# Patient Record
Sex: Female | Born: 1985 | Race: White | Hispanic: No | Marital: Married | Smoking: Former smoker
Health system: Southern US, Community
[De-identification: ages and names within clinical notes are randomized; demographics above are authoritative.]

## PROBLEM LIST (undated history)

## (undated) DIAGNOSIS — B009 Herpesviral infection, unspecified: Secondary | ICD-10-CM

## (undated) DIAGNOSIS — E079 Disorder of thyroid, unspecified: Secondary | ICD-10-CM

## (undated) DIAGNOSIS — K219 Gastro-esophageal reflux disease without esophagitis: Secondary | ICD-10-CM

## (undated) DIAGNOSIS — F419 Anxiety disorder, unspecified: Secondary | ICD-10-CM

## (undated) DIAGNOSIS — J40 Bronchitis, not specified as acute or chronic: Secondary | ICD-10-CM

## (undated) HISTORY — DX: Bronchitis, not specified as acute or chronic: J40

## (undated) HISTORY — DX: Herpesviral infection, unspecified: B00.9

## (undated) HISTORY — DX: Anxiety disorder, unspecified: F41.9

## (undated) HISTORY — DX: Disorder of thyroid, unspecified: E07.9

## (undated) HISTORY — DX: Gastro-esophageal reflux disease without esophagitis: K21.9

---
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
## 2004-02-05 ENCOUNTER — Other Ambulatory Visit: Admission: RE | Admit: 2004-02-05 | Discharge: 2004-02-05 | Payer: Self-pay | Admitting: Obstetrics and Gynecology

## 2004-03-11 ENCOUNTER — Ambulatory Visit: Payer: Self-pay | Admitting: Pediatrics

## 2004-08-15 ENCOUNTER — Ambulatory Visit: Payer: Self-pay | Admitting: Pediatrics

## 2005-02-14 ENCOUNTER — Ambulatory Visit: Payer: Self-pay | Admitting: Pediatrics

## 2005-04-17 ENCOUNTER — Other Ambulatory Visit: Admission: RE | Admit: 2005-04-17 | Discharge: 2005-04-17 | Payer: Self-pay | Admitting: Obstetrics and Gynecology

## 2005-07-07 ENCOUNTER — Ambulatory Visit: Payer: Self-pay | Admitting: Pediatrics

## 2005-11-20 ENCOUNTER — Ambulatory Visit: Payer: Self-pay | Admitting: Pediatrics

## 2006-03-19 ENCOUNTER — Ambulatory Visit: Payer: Self-pay | Admitting: Pediatrics

## 2006-05-14 ENCOUNTER — Other Ambulatory Visit: Admission: RE | Admit: 2006-05-14 | Discharge: 2006-05-14 | Payer: Self-pay | Admitting: Obstetrics and Gynecology

## 2006-07-23 ENCOUNTER — Ambulatory Visit: Payer: Self-pay | Admitting: Pediatrics

## 2006-12-01 ENCOUNTER — Ambulatory Visit: Payer: Self-pay | Admitting: Pediatrics

## 2007-05-24 ENCOUNTER — Other Ambulatory Visit: Admission: RE | Admit: 2007-05-24 | Discharge: 2007-05-24 | Payer: Self-pay | Admitting: Obstetrics and Gynecology

## 2008-07-04 ENCOUNTER — Encounter: Admission: RE | Admit: 2008-07-04 | Discharge: 2008-07-04 | Payer: Self-pay | Admitting: Internal Medicine

## 2009-06-25 ENCOUNTER — Other Ambulatory Visit: Admission: RE | Admit: 2009-06-25 | Discharge: 2009-06-25 | Payer: Self-pay | Admitting: Internal Medicine

## 2010-10-10 ENCOUNTER — Other Ambulatory Visit (HOSPITAL_COMMUNITY)
Admission: RE | Admit: 2010-10-10 | Discharge: 2010-10-10 | Disposition: A | Discharge status: Home / Self Care | Payer: Self-pay | Source: Ambulatory Visit | Attending: Internal Medicine | Admitting: Internal Medicine

## 2010-10-10 DIAGNOSIS — Z01419 Encounter for gynecological examination (general) (routine) without abnormal findings: Secondary | ICD-10-CM | POA: Insufficient documentation

## 2011-12-12 ENCOUNTER — Other Ambulatory Visit (HOSPITAL_COMMUNITY)
Admission: RE | Admit: 2011-12-12 | Discharge: 2011-12-12 | Disposition: A | Discharge status: Home / Self Care | Payer: BC Managed Care – PPO | Source: Ambulatory Visit | Attending: Internal Medicine | Admitting: Internal Medicine

## 2011-12-12 DIAGNOSIS — Z01419 Encounter for gynecological examination (general) (routine) without abnormal findings: Secondary | ICD-10-CM | POA: Insufficient documentation

## 2012-07-01 ENCOUNTER — Ambulatory Visit: Payer: BC Managed Care – PPO | Admitting: Family Medicine

## 2013-04-01 ENCOUNTER — Other Ambulatory Visit (HOSPITAL_COMMUNITY)
Admission: RE | Admit: 2013-04-01 | Discharge: 2013-04-01 | Disposition: A | Discharge status: Home / Self Care | Payer: BC Managed Care – PPO | Source: Ambulatory Visit | Attending: Internal Medicine | Admitting: Internal Medicine

## 2013-04-01 DIAGNOSIS — Z01419 Encounter for gynecological examination (general) (routine) without abnormal findings: Secondary | ICD-10-CM | POA: Insufficient documentation

## 2013-04-12 ENCOUNTER — Encounter: Payer: Self-pay | Admitting: Nurse Practitioner

## 2014-04-05 ENCOUNTER — Other Ambulatory Visit (HOSPITAL_COMMUNITY)
Admission: RE | Admit: 2014-04-05 | Discharge: 2014-04-05 | Disposition: A | Discharge status: Home / Self Care | Payer: Managed Care, Other (non HMO) | Source: Ambulatory Visit | Attending: Internal Medicine | Admitting: Internal Medicine

## 2014-04-05 DIAGNOSIS — Z01419 Encounter for gynecological examination (general) (routine) without abnormal findings: Secondary | ICD-10-CM | POA: Diagnosis present

## 2015-02-07 ENCOUNTER — Other Ambulatory Visit (HOSPITAL_COMMUNITY)
Admission: RE | Admit: 2015-02-07 | Discharge: 2015-02-07 | Disposition: A | Discharge status: Home / Self Care | Payer: Managed Care, Other (non HMO) | Source: Ambulatory Visit | Attending: Internal Medicine | Admitting: Internal Medicine

## 2015-02-07 DIAGNOSIS — Z01419 Encounter for gynecological examination (general) (routine) without abnormal findings: Secondary | ICD-10-CM | POA: Insufficient documentation

## 2016-03-25 DIAGNOSIS — R490 Dysphonia: Secondary | ICD-10-CM | POA: Insufficient documentation

## 2016-09-11 ENCOUNTER — Other Ambulatory Visit: Payer: Self-pay | Admitting: Otolaryngology

## 2016-09-11 DIAGNOSIS — R490 Dysphonia: Secondary | ICD-10-CM

## 2016-09-18 ENCOUNTER — Ambulatory Visit
Admission: RE | Admit: 2016-09-18 | Discharge: 2016-09-18 | Disposition: A | Discharge status: Home / Self Care | Payer: Self-pay | Source: Ambulatory Visit | Attending: Otolaryngology | Admitting: Otolaryngology

## 2016-09-18 ENCOUNTER — Encounter: Payer: Self-pay | Admitting: Radiology

## 2016-09-18 DIAGNOSIS — R490 Dysphonia: Secondary | ICD-10-CM

## 2016-09-26 ENCOUNTER — Encounter: Payer: Self-pay | Admitting: Gastroenterology

## 2016-10-15 ENCOUNTER — Ambulatory Visit (INDEPENDENT_AMBULATORY_CARE_PROVIDER_SITE_OTHER): Payer: Managed Care, Other (non HMO) | Admitting: Physician Assistant

## 2016-10-15 ENCOUNTER — Encounter: Payer: Self-pay | Admitting: Physician Assistant

## 2016-10-15 VITALS — BP 124/80 | HR 106 | Ht 61.0 in | Wt 158.8 lb

## 2016-10-15 DIAGNOSIS — K219 Gastro-esophageal reflux disease without esophagitis: Secondary | ICD-10-CM | POA: Diagnosis not present

## 2016-10-15 DIAGNOSIS — R490 Dysphonia: Secondary | ICD-10-CM | POA: Diagnosis not present

## 2016-10-15 MED ORDER — OMEPRAZOLE 20 MG PO CPDR: 20.0000 mg | DELAYED_RELEASE_CAPSULE | Freq: Two times a day (BID) | ORAL | 6 refills | Status: DC

## 2016-10-15 NOTE — Progress Notes (Signed)
Subjective:    Patient ID: Chelsea Jackson, female    DOB: 1985/04/23, 31 y.o.   MRN: [id number]  HPI Chelsea Jackson is a pleasant 31 year old white female, new to GI today referred by [facility] ENT/Dr. Constance Holster for evaluation of persistent hoarseness. Patient is generally in good health, no previous GI evaluation. She says her current symptoms started in January 2018 at which point she had had a laryngitis and bronchitis, was sick for several weeks and says he had no more recent times. Later in the winter she also developed C. difficile. Ever since that time she says she's had a lot of mucous in her throat, feels like she needs to keep clearing her throat and cough which at times has been productive. She doesn't believe that she has any sinus issues. She developed hoarseness that has persisted over the past several months. She had undergone ENT evaluation with laryngoscopy which was negative.   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 Patient was placed on omeprazole 20 mg by mouth every morning by her PCP over the past couple of weeks. She hasn't noticed much difference. She says her voice is better if she is less stressed and doesn't talk as much. She does get intermittent indigestion but had not been having any daily symptoms with heartburn reflux or indigestion. She denies any dysphagia or odynophagia. She does have a wedge pillow that she's been using because she had been having frequent nighttime cough. She is a nonsmoker. She is very anxious about her ongoing hoarseness and wants to have further workup.  Review of Systems Pertinent positive and negative review of systems were noted in the above HPI section.  All other review of systems was otherwise  negative.  Outpatient Encounter Prescriptions as of 10/15/2016  Medication Sig  . acyclovir (ZOVIRAX) 200 MG capsule Take 200 mg by mouth daily.  Marland Kitchen amphetamine-dextroamphetamine (ADDERALL) 30 MG tablet Take 30 mg by mouth daily.  . divalproex (DEPAKOTE) 500 MG DR tablet Take 500 mg by mouth 2 (two) times daily. Takes four in the morning and four at night  . levothyroxine (SYNTHROID, LEVOTHROID) 50 MCG tablet Take 50 mcg by mouth daily before breakfast.  . Multiple Vitamins-Minerals (MULTIVITAMIN ADULT PO) Take by mouth daily.  . norethindrone-ethinyl estradiol 1/35 (ORTHO-NOVUM, NORTREL,CYCLAFEM) tablet Take 1 tablet by mouth daily.  Marland Kitchen omeprazole (PRILOSEC) 20 MG capsule Take 1 capsule (20 mg total) by mouth 2 (two) times daily before a meal.  . [DISCONTINUED] omeprazole (PRILOSEC) 20 MG capsule Take 20 mg by mouth daily.   No facility-administered encounter medications on file as of 10/15/2016.    No Known Allergies There are no active problems to display for this patient.  Social History   Social History  . Marital status: Single    Spouse name: N/A  . Number of children: N/A  . Years of education: N/A   Occupational History  . Not on file.   Social History Main Topics  . Smoking status: Never Smoker  . Smokeless tobacco: Never Used  . Alcohol use No  . Drug use: No  . Sexual activity: Not on file   Other Topics Concern  . Not on file   Social History Narrative  . No narrative on file    Chelsea Jackson's  family history includes Leukemia in her mother.      Objective:    Vitals:   10/15/16 1022  BP: 124/80  Pulse: (!) 106  SpO2: 98%    Physical Exam well-developed white female in no acute distress, Blood pressure 124/80, pulse 106, BMI 30. HEENT; nontraumatic normocephalic EOMI PERRLA sclera anicteric, Neck ;supple no thyromegaly, Cardiovascular ;regular rate and rhythm with S1-S2 no murmur or gallop, Pulmonary ;clear bilaterally, Abdomen ;soft, nontender  nondistended bowel sounds are active there is no palpable mass or hepatosplenomegaly, Extremities; no clubbing cyanosis or edema skin warm dry, Neuropsych ;mood and affect appropriate       Assessment & Plan:   #69 31 year old white female with a 9 month history of persistent hoarseness, sense of fullness in her throat and increased mucus. Recent laryngoscopy was unrevealing. Barium swallow showed a patulous GE junction and evidence of significant GERD. Her hoarseness may be secondary to acid reflux although with mucus and cough may be multifactorial. #2 hypothyroid  Plan; Strict antireflux regimen was reviewed with patient, including elevation of the head of the bed and nothing by mouth for 3 hours prior to bedtime. She was given an antireflux diet as well. We will increase omeprazole to 20 mg by mouth twice a day to be taken before meals breakfast and before meals dinner. Schedule for upper endoscopy with Dr. Fuller Plan. Procedure discussed in detail with patient including risks and benefits and she is agreeable to proceed. Further plans pending results of EGD and response to higher dose PPI therapy.  Amy Genia Harold PA-C 10/15/2016   Cc: Deland Pretty, MD

## 2016-10-15 NOTE — Patient Instructions (Addendum)
We have sent the following medications to your pharmacy for you to pick up at your convenience: Chelsea Jackson. 1. Omeprazole 20 mg- take 1 tab twice daily.  We have provided you with Reflux handouts.   You have been scheduled for an endoscopy. Please follow written instructions given to you at your visit today. If you use inhalers (even only as needed), please bring them with you on the day of your procedure. Your physician has requested that you go to www.startemmi.com and enter the access code given to you at your visit today. This web site gives a general overview about your procedure. However, you should still follow specific instructions given to you by our office regarding your preparation for the procedure.

## 2016-10-16 NOTE — Progress Notes (Signed)
Reviewed and agree with management plan.  Arlean Thies T. Ahamed Hofland, MD FACG 

## 2016-10-23 ENCOUNTER — Encounter: Payer: Self-pay | Admitting: Gastroenterology

## 2016-11-03 ENCOUNTER — Telehealth: Payer: Self-pay | Admitting: Physician Assistant

## 2016-11-03 NOTE — Telephone Encounter (Signed)
Patient takes Depakote for mood stabilizing. She will hold this and her other medications until after her procedure.

## 2016-11-06 ENCOUNTER — Encounter: Payer: Self-pay | Admitting: Gastroenterology

## 2016-11-06 ENCOUNTER — Ambulatory Visit (AMBULATORY_SURGERY_CENTER): Payer: Managed Care, Other (non HMO) | Admitting: Gastroenterology

## 2016-11-06 VITALS — BP 118/61 | HR 74 | Temp 98.0°F | Resp 13 | Ht 61.0 in | Wt 158.0 lb

## 2016-11-06 DIAGNOSIS — K219 Gastro-esophageal reflux disease without esophagitis: Secondary | ICD-10-CM | POA: Diagnosis not present

## 2016-11-06 MED ORDER — OMEPRAZOLE 40 MG PO CPDR: 40.0000 mg | DELAYED_RELEASE_CAPSULE | Freq: Two times a day (BID) | ORAL | 3 refills | Status: DC

## 2016-11-06 MED ORDER — SODIUM CHLORIDE 0.9 % IV SOLN: 500.0000 mL | INTRAVENOUS | Status: DC

## 2016-11-06 NOTE — Progress Notes (Signed)
Report given to PACU, vss 

## 2016-11-06 NOTE — Progress Notes (Signed)
Pt's states no medical or surgical changes since previsit or office visit. 

## 2016-11-06 NOTE — Op Note (Signed)
[facility] Patient Name: Chelsea Jackson Procedure Date: 11/06/2016 11:52 AM MRN: [id number] Endoscopist: Ladene Artist , MD Age: 31 Referring MD:  Date of Birth: [birth year] Gender: Female Account #: [id number] Procedure:                Upper GI endoscopy Indications:              Gastroesophageal reflux disease Medicines:                Monitored Anesthesia Care Procedure:                Pre-Anesthesia Assessment:                           - Prior to the procedure, a History and Physical                            was performed, and patient medications and                            allergies were reviewed. The patient's tolerance of                            previous anesthesia was also reviewed. The risks                            and benefits of the procedure and the sedation                            options and risks were discussed with the patient.                            All questions were answered, and informed consent                            was obtained. Prior Anticoagulants: The patient has                            taken no previous anticoagulant or antiplatelet                            agents. ASA Grade Assessment: II - A patient with                            mild systemic disease. After reviewing the risks                            and benefits, the patient was deemed in                            satisfactory condition to undergo the procedure.                           After obtaining informed consent, the endoscope was  passed under direct vision. Throughout the                            procedure, the patient's blood pressure, pulse, and                            oxygen saturations were monitored continuously. The                            Endoscope was introduced through the mouth, and                            advanced to the second part of duodenum. The upper                            GI endoscopy was  accomplished without difficulty.                            The patient tolerated the procedure well. Scope In: Scope Out: Findings:                 The esophagus was normal.                           The stomach was normal.                           The examined duodenum was normal.                           The cardia and gastric fundus were normal on                            retroflexion. Complications:            No immediate complications. Estimated Blood Loss:     Estimated blood loss: none. Impression:               - Normal esophagus.                           - Normal stomach.                           - Normal examined duodenum.                           - No specimens collected. Recommendation:           - Patient has a contact number available for                            emergencies. The signs and symptoms of potential                            delayed complications were discussed with the  patient. Return to normal activities tomorrow.                            Written discharge instructions were provided to the                            patient.                           - Resume previous diet.                           - Follow all antireflux measures.                           - Continue present medications.                           - Prilosec (omeprazole) 40 mg PO BID for 3 months.                           - Return to GI office in 2 months. Ladene Artist, MD 11/06/2016 12:13:01 PM This report has been signed electronically.

## 2016-11-06 NOTE — Patient Instructions (Signed)
Discharge instructions given. Prescription sent to pharmacy. Resume previous medications. YOU HAD AN ENDOSCOPIC PROCEDURE TODAY AT [facility]:   Refer to the procedure report that was given to you for any specific questions about what was found during the examination.  If the procedure report does not answer your questions, please call your gastroenterologist to clarify.  If you requested that your care partner not be given the details of your procedure findings, then the procedure report has been included in a sealed envelope for you to review at your convenience later.  YOU SHOULD EXPECT: Some feelings of bloating in the abdomen. Passage of more gas than usual.  Walking can help get rid of the air that was put into your GI tract during the procedure and reduce the bloating. If you had a lower endoscopy (such as a colonoscopy or flexible sigmoidoscopy) you may notice spotting of blood in your stool or on the toilet paper. If you underwent a bowel prep for your procedure, you may not have a normal bowel movement for a few days.  Please Note:  You might notice some irritation and congestion in your nose or some drainage.  This is from the oxygen used during your procedure.  There is no need for concern and it should clear up in a day or so.  SYMPTOMS TO REPORT IMMEDIATELY:    Following upper endoscopy (EGD)  Vomiting of blood or coffee ground material  New chest pain or pain under the shoulder blades  Painful or persistently difficult swallowing  New shortness of breath  Fever of 100F or higher  Black, tarry-looking stools  For urgent or emergent issues, a gastroenterologist can be reached at any hour by calling [phone].   DIET:  We do recommend a small meal at first, but then you may proceed to your regular diet.  Drink plenty of fluids but you should avoid alcoholic beverages for 24 hours.  ACTIVITY:  You should plan to take it easy for the rest of today and you  should NOT DRIVE or use heavy machinery until tomorrow (because of the sedation medicines used during the test).    FOLLOW UP: Our staff will call the number listed on your records the next business day following your procedure to check on you and address any questions or concerns that you may have regarding the information given to you following your procedure. If we do not reach you, we will leave a message.  However, if you are feeling well and you are not experiencing any problems, there is no need to return our call.  We will assume that you have returned to your regular daily activities without incident.  If any biopsies were taken you will be contacted by phone or by letter within the next 1-3 weeks.  Please call us at [phone] if you have not heard about the biopsies in 3 weeks.    SIGNATURES/CONFIDENTIALITY: You and/or your care partner have signed paperwork which will be entered into your electronic medical record.  These signatures attest to the fact that that the information above on your After Visit Summary has been reviewed and is understood.  Full responsibility of the confidentiality of this discharge information lies with you and/or your care-partner.

## 2016-11-07 ENCOUNTER — Telehealth: Payer: Self-pay

## 2016-11-07 ENCOUNTER — Telehealth: Payer: Self-pay | Admitting: *Deleted

## 2016-11-07 NOTE — Telephone Encounter (Signed)
Second phone call attempt, name identifier, left message.

## 2016-11-07 NOTE — Telephone Encounter (Signed)
  Follow up Call-  Call back number 11/06/2016  Post procedure Call Back phone  # [phone]  Permission to leave phone message Yes  Some recent data might be hidden     No answer, left message.

## 2016-11-19 ENCOUNTER — Ambulatory Visit: Payer: Managed Care, Other (non HMO) | Admitting: Gastroenterology

## 2016-12-31 ENCOUNTER — Other Ambulatory Visit: Payer: Self-pay

## 2016-12-31 DIAGNOSIS — K219 Gastro-esophageal reflux disease without esophagitis: Secondary | ICD-10-CM

## 2016-12-31 MED ORDER — OMEPRAZOLE 40 MG PO CPDR: 40.0000 mg | DELAYED_RELEASE_CAPSULE | Freq: Two times a day (BID) | ORAL | 0 refills | Status: DC

## 2017-01-07 ENCOUNTER — Ambulatory Visit: Payer: Managed Care, Other (non HMO) | Admitting: Gastroenterology

## 2017-04-01 ENCOUNTER — Other Ambulatory Visit: Payer: Self-pay | Admitting: Gastroenterology

## 2017-04-01 DIAGNOSIS — K219 Gastro-esophageal reflux disease without esophagitis: Secondary | ICD-10-CM

## 2017-05-11 ENCOUNTER — Other Ambulatory Visit: Payer: Self-pay | Admitting: Gastroenterology

## 2017-05-11 DIAGNOSIS — K219 Gastro-esophageal reflux disease without esophagitis: Secondary | ICD-10-CM

## 2017-08-09 ENCOUNTER — Other Ambulatory Visit: Payer: Self-pay | Admitting: Gastroenterology

## 2017-08-09 DIAGNOSIS — K219 Gastro-esophageal reflux disease without esophagitis: Secondary | ICD-10-CM

## 2017-12-30 ENCOUNTER — Encounter: Payer: Self-pay | Admitting: Emergency Medicine

## 2017-12-30 DIAGNOSIS — F988 Other specified behavioral and emotional disorders with onset usually occurring in childhood and adolescence: Secondary | ICD-10-CM | POA: Insufficient documentation

## 2018-01-07 ENCOUNTER — Telehealth: Payer: Self-pay

## 2018-01-07 NOTE — Telephone Encounter (Signed)
Prior approval for amphetamine-dextroamphetamine 30mg  effective 01/07/2018-01/06/2021

## 2018-01-21 ENCOUNTER — Encounter: Payer: Self-pay | Admitting: Psychiatry

## 2018-01-21 ENCOUNTER — Ambulatory Visit (INDEPENDENT_AMBULATORY_CARE_PROVIDER_SITE_OTHER): Payer: 59 | Admitting: Psychiatry

## 2018-01-21 VITALS — BP 116/76 | HR 87

## 2018-01-21 DIAGNOSIS — F6381 Intermittent explosive disorder: Secondary | ICD-10-CM | POA: Diagnosis not present

## 2018-01-21 DIAGNOSIS — F902 Attention-deficit hyperactivity disorder, combined type: Secondary | ICD-10-CM

## 2018-01-21 MED ORDER — AMPHETAMINE-DEXTROAMPHET ER 30 MG PO CP24: 30.0000 mg | ORAL_CAPSULE | Freq: Every day | ORAL | 0 refills | Status: DC

## 2018-01-21 NOTE — Progress Notes (Signed)
GEOVANA GEBEL [id number] 02/08/1985 33 y.o.  Subjective:   Patient ID:  Chelsea Jackson is a 33 y.o. (DOB [birth year]) female.  Chief Complaint:  Chief Complaint  Patient presents with  . Follow-up    Medication Management    HPI Addison Freimuth Biehn presents to the office today for follow-up of ADD and anger problems.  Sister Died brain aneurysm 2 mos ago. Sister took Adderall and was stressed.  Did not take Depakote.   F severely depressed and had to be hospitalized.  Coparenting 56 yo nephew.    Benefit from the Adderall.  Dropped the Depakote to 2000 HS for 3 weeks.  She felt maybe the meds would increase her health risk.  No worsening anger problems.  No major mood swings.  No physical acting out anger in a long time.  Has come a long way.  No anger problems with nephew.     Previous Psych Trials include: Carbamazepine, Depakote, Lithium, Risperidone,Vyvanse, Nuvigil and Concerta. Review of Systems:  Review of Systems  Neurological: Negative for tremors and weakness.  Psychiatric/Behavioral: Negative for agitation, behavioral problems, confusion, decreased concentration, dysphoric mood, hallucinations, self-injury, sleep disturbance and suicidal ideas. The patient is not nervous/anxious and is not hyperactive.     Medications: I have reviewed the patient's current medications.  Current Outpatient Medications  Medication Sig Dispense Refill  . acyclovir (ZOVIRAX) 200 MG capsule Take 200 mg by mouth daily.    Marland Kitchen amphetamine-dextroamphetamine (ADDERALL) 30 MG tablet Take 30 mg by mouth daily.    . divalproex (DEPAKOTE) 500 MG DR tablet Take 500 mg by mouth 2 (two) times daily. Takes four in the morning and four at night    . levothyroxine (SYNTHROID, LEVOTHROID) 50 MCG tablet Take 50 mcg by mouth daily before breakfast.    . Multiple Vitamins-Minerals (MULTIVITAMIN ADULT PO) Take by mouth daily.    . norethindrone-ethinyl estradiol 1/35 (ORTHO-NOVUM, NORTREL,CYCLAFEM) tablet Take 1  tablet by mouth daily.    Marland Kitchen omeprazole (PRILOSEC) 40 MG capsule TAKE 1 CAPSULE BY MOUTH TWICE A DAY 180 capsule 0  . amphetamine-dextroamphetamine (ADDERALL XR) 30 MG 24 hr capsule Take 1 capsule (30 mg total) by mouth daily. 30 capsule 0  . [START ON 02/18/2018] amphetamine-dextroamphetamine (ADDERALL XR) 30 MG 24 hr capsule Take 1 capsule (30 mg total) by mouth daily. 30 capsule 0  . [START ON 03/18/2018] amphetamine-dextroamphetamine (ADDERALL XR) 30 MG 24 hr capsule Take 1 capsule (30 mg total) by mouth daily. 30 capsule 0   Current Facility-Administered Medications  Medication Dose Route Frequency Provider Last Rate Last Dose  . 0.9 %  sodium chloride infusion  500 mL Intravenous Continuous Ladene Artist, MD        Medication Side Effects: None  Allergies: No Known Allergies  Past Medical History:  Diagnosis Date  . Bronchitis   . GERD (gastroesophageal reflux disease)   . Herpes   . Thyroid disease     Family History  Problem Relation Age of Onset  . Leukemia Mother     Social History   Socioeconomic History  . Marital status: Single    Spouse name: Not on file  . Number of children: Not on file  . Years of education: Not on file  . Highest education level: Not on file  Occupational History  . Not on file  Social Needs  . Financial resource strain: Not on file  . Food insecurity:    Worry: Not on file    Inability:  Not on file  . Transportation needs:    Medical: Not on file    Non-medical: Not on file  Tobacco Use  . Smoking status: Never Smoker  . Smokeless tobacco: Never Used  Substance and Sexual Activity  . Alcohol use: No  . Drug use: No  . Sexual activity: Not on file  Lifestyle  . Physical activity:    Days per week: Not on file    Minutes per session: Not on file  . Stress: Not on file  Relationships  . Social connections:    Talks on phone: Not on file    Gets together: Not on file    Attends religious service: Not on file    Active  member of club or organization: Not on file    Attends meetings of clubs or organizations: Not on file    Relationship status: Not on file  . Intimate partner violence:    Fear of current or ex partner: Not on file    Emotionally abused: Not on file    Physically abused: Not on file    Forced sexual activity: Not on file  Other Topics Concern  . Not on file  Social History Narrative  . Not on file    Past Medical History, Surgical history, Social history, and Family history were reviewed and updated as appropriate.   Please see review of systems for further details on the patient's review from today.   Objective:   Physical Exam:  BP 116/76   Pulse 87   Physical Exam Constitutional:      General: She is not in acute distress.    Appearance: She is well-developed.  Musculoskeletal:        General: No deformity.  Neurological:     Mental Status: She is alert and oriented to person, place, and time.     Motor: No tremor.     Coordination: Coordination normal.     Gait: Gait normal.  Psychiatric:        Attention and Perception: Attention and perception normal.        Mood and Affect: Mood is not anxious or depressed. Affect is not labile, blunt, angry or inappropriate.        Speech: Speech normal.        Behavior: Behavior normal.        Thought Content: Thought content normal. Thought content does not include homicidal or suicidal ideation. Thought content does not include homicidal or suicidal plan.        Cognition and Memory: Cognition normal.        Judgment: Judgment normal.     Comments: Insight intact. No auditory or visual hallucinations. No delusions.      Lab Review:  No results found for: NA, K, CL, CO2, GLUCOSE, BUN, CREATININE, CALCIUM, PROT, ALBUMIN, AST, ALT, ALKPHOS, BILITOT, GFRNONAA, GFRAA  No results found for: WBC, RBC, HGB, HCT, PLT, MCV, MCH, MCHC, RDW, LYMPHSABS, MONOABS, EOSABS, BASOSABS  No results found for: POCLITH, LITHIUM   No results  found for: PHENYTOIN, PHENOBARB, VALPROATE, CBMZ   .res Assessment: Plan:    Attention deficit hyperactivity disorder (ADHD), combined type - Plan: amphetamine-dextroamphetamine (ADDERALL XR) 30 MG 24 hr capsule, amphetamine-dextroamphetamine (ADDERALL XR) 30 MG 24 hr capsule, amphetamine-dextroamphetamine (ADDERALL XR) 30 MG 24 hr capsule  Intermittent explosive disorder in adult   Again discussed the difference between bipolar disorder and ADD and anger.  She does not have sustained hypomanic nor manic episodes.  If has  then call  And we'll increase the dosage.  Sx well controlled.  Do not decrease the dosage further.  Cont Adderall DT benefit.  FU 6 mos.  Lynder Parents, MD, DFAPA   Please see After Visit Summary for patient specific instructions.  No future appointments.  No orders of the defined types were placed in this encounter.     -------------------------------

## 2018-01-21 NOTE — Progress Notes (Signed)
Previous Psych Trials include: Carbamazepine, Depakote, Lithium, Risperidone,Vyvanse, Nuvigil and Concerta.

## 2018-05-06 ENCOUNTER — Telehealth: Payer: Self-pay | Admitting: Psychiatry

## 2018-05-06 ENCOUNTER — Other Ambulatory Visit: Payer: Self-pay

## 2018-05-06 DIAGNOSIS — F902 Attention-deficit hyperactivity disorder, combined type: Secondary | ICD-10-CM

## 2018-05-06 MED ORDER — AMPHETAMINE-DEXTROAMPHET ER 30 MG PO CP24: 30.0000 mg | ORAL_CAPSULE | Freq: Every day | ORAL | 0 refills | Status: DC

## 2018-05-06 NOTE — Telephone Encounter (Signed)
Patient need refill Adderall XR 30 mg., to be sent to [facility] on [address] In [place]

## 2018-05-06 NOTE — Telephone Encounter (Signed)
Pended for approval.

## 2018-05-06 NOTE — Telephone Encounter (Signed)
Fannie called to request refill of her Adderall XR 30 mg..  Next appt 07/28/18.  [facility] [address] [place]

## 2018-05-06 NOTE — Telephone Encounter (Signed)
This is a duplicate message from earlier.

## 2018-06-01 ENCOUNTER — Other Ambulatory Visit: Payer: Self-pay

## 2018-06-01 MED ORDER — DIVALPROEX SODIUM ER 500 MG PO TB24: 2000.0000 mg | ORAL_TABLET | Freq: Two times a day (BID) | ORAL | 0 refills | Status: DC

## 2018-06-02 ENCOUNTER — Telehealth: Payer: Self-pay | Admitting: Psychiatry

## 2018-06-02 NOTE — Telephone Encounter (Signed)
Pt already has rx for adderall at pharmacy, ok to fill tomorrow

## 2018-06-02 NOTE — Telephone Encounter (Signed)
Pt needs refill on Adderall 30mg  xr [facility] to [facility] [address].

## 2018-06-28 ENCOUNTER — Telehealth: Payer: Self-pay | Admitting: Psychiatry

## 2018-06-28 NOTE — Telephone Encounter (Signed)
Last fill 05/30, already has rx on file earliest fill 07/01/2018

## 2018-06-28 NOTE — Telephone Encounter (Signed)
Patient need refill on Adderall 30 mg., to be [facility] to [facility] [address] in [place]

## 2018-07-21 ENCOUNTER — Ambulatory Visit: Payer: 59 | Admitting: Psychiatry

## 2018-07-28 ENCOUNTER — Ambulatory Visit (INDEPENDENT_AMBULATORY_CARE_PROVIDER_SITE_OTHER): Payer: 59 | Admitting: Psychiatry

## 2018-07-28 ENCOUNTER — Encounter: Payer: Self-pay | Admitting: Psychiatry

## 2018-07-28 ENCOUNTER — Other Ambulatory Visit: Payer: Self-pay

## 2018-07-28 VITALS — BP 110/70 | HR 88

## 2018-07-28 DIAGNOSIS — F902 Attention-deficit hyperactivity disorder, combined type: Secondary | ICD-10-CM

## 2018-07-28 DIAGNOSIS — F6381 Intermittent explosive disorder: Secondary | ICD-10-CM

## 2018-07-28 MED ORDER — AMPHETAMINE-DEXTROAMPHET ER 30 MG PO CP24: 30.0000 mg | ORAL_CAPSULE | Freq: Every day | ORAL | 0 refills | Status: DC

## 2018-07-28 MED ORDER — DIVALPROEX SODIUM ER 500 MG PO TB24: 2000.0000 mg | ORAL_TABLET | Freq: Two times a day (BID) | ORAL | 1 refills | Status: DC

## 2018-07-28 NOTE — Progress Notes (Signed)
Chelsea Jackson [id number] [birth year] 33 y.o.  Subjective:   Patient ID:  Chelsea Jackson is a 33 y.o. (DOB [birth year]) female.  Chief Complaint:  Chief Complaint  Patient presents with  . Follow-up    Medication Management and anger px  . ADD    Medication Management    HPI [name] presents to the office today for follow-up of ADD and anger problems.  Last seen in January 2020.  No meds were changed.  She was benefiting from the medication.  Sister Died brain aneurysm 2 mos ago. Sister took Adderall and was stressed.  Did not take Depakote.   F severely depressed and had to be hospitalized.  Coparenting 43 yo nephew.  Her family disowned her and that's been hard.  Had to get custody of nephew with  Court system.    Created a lot of stress and H doesn't handle stress well.  She feels she's grown and handled things much better without physical aggression as in the past.  [name] has helped a lot.  Nephew is 49 yo.  May need a letter to help her.    No complaints   Benefit from the Adderall.  Dropped the Depakote to 2000 HS for 3 weeks.  She felt maybe the meds would increase her health risk.  No worsening anger problems.  No major mood swings.  No physical acting out anger in a long time.  Has come a long way.  No anger problems with nephew.   Previous Psych Trials include: Carbamazepine, Depakote, Lithium, Risperidone,Vyvanse, Nuvigil and Concerta.  Review of Systems:  Review of Systems  Neurological: Negative for tremors and weakness.  Psychiatric/Behavioral: Negative for agitation, behavioral problems, confusion, decreased concentration, dysphoric mood, hallucinations, self-injury, sleep disturbance and suicidal ideas. The patient is not nervous/anxious and is not hyperactive.     Medications: I have reviewed the patient's current medications.  Current Outpatient Medications  Medication Sig Dispense Refill  . acyclovir (ZOVIRAX) 200 MG capsule Take 200 mg by mouth daily.    Marland Kitchen  amphetamine-dextroamphetamine (ADDERALL XR) 30 MG 24 hr capsule Take 1 capsule (30 mg total) by mouth daily. 30 capsule 0  . amphetamine-dextroamphetamine (ADDERALL XR) 30 MG 24 hr capsule Take 1 capsule (30 mg total) by mouth daily. 30 capsule 0  . amphetamine-dextroamphetamine (ADDERALL XR) 30 MG 24 hr capsule Take 1 capsule (30 mg total) by mouth daily. 30 capsule 0  . amphetamine-dextroamphetamine (ADDERALL) 30 MG tablet Take 30 mg by mouth daily.    . divalproex (DEPAKOTE ER) 500 MG 24 hr tablet Take 4 tablets (2,000 mg total) by mouth 2 (two) times a day. 720 tablet 0  . levothyroxine (SYNTHROID, LEVOTHROID) 50 MCG tablet Take 50 mcg by mouth daily before breakfast.    . Multiple Vitamins-Minerals (MULTIVITAMIN ADULT PO) Take by mouth daily.    . norethindrone-ethinyl estradiol 1/35 (ORTHO-NOVUM, NORTREL,CYCLAFEM) tablet Take 1 tablet by mouth daily.    Marland Kitchen omeprazole (PRILOSEC) 40 MG capsule TAKE 1 CAPSULE BY MOUTH TWICE A DAY 180 capsule 0  . fluticasone (FLONASE) 50 MCG/ACT nasal spray SPRAY 1 SPRAY INTO EACH NOSTRIL EVERY DAY     Current Facility-Administered Medications  Medication Dose Route Frequency Provider Last Rate Last Dose  . 0.9 %  sodium chloride infusion  500 mL Intravenous Continuous Ladene Artist, MD        Medication Side Effects: None  Allergies: No Known Allergies  Past Medical History:  Diagnosis Date  . Bronchitis   .  GERD (gastroesophageal reflux disease)   . Herpes   . Thyroid disease     Family History  Problem Relation Age of Onset  . Leukemia Mother     Social History   Socioeconomic History  . Marital status: Single    Spouse name: Not on file  . Number of children: Not on file  . Years of education: Not on file  . Highest education level: Not on file  Occupational History  . Not on file  Social Needs  . Financial resource strain: Not on file  . Food insecurity    Worry: Not on file    Inability: Not on file  . Transportation needs     Medical: Not on file    Non-medical: Not on file  Tobacco Use  . Smoking status: Never Smoker  . Smokeless tobacco: Never Used  Substance and Sexual Activity  . Alcohol use: No  . Drug use: No  . Sexual activity: Not on file  Lifestyle  . Physical activity    Days per week: Not on file    Minutes per session: Not on file  . Stress: Not on file  Relationships  . Social Herbalist on phone: Not on file    Gets together: Not on file    Attends religious service: Not on file    Active member of club or organization: Not on file    Attends meetings of clubs or organizations: Not on file    Relationship status: Not on file  . Intimate partner violence    Fear of current or ex partner: Not on file    Emotionally abused: Not on file    Physically abused: Not on file    Forced sexual activity: Not on file  Other Topics Concern  . Not on file  Social History Narrative  . Not on file    Past Medical History, Surgical history, Social history, and Family history were reviewed and updated as appropriate.   Please see review of systems for further details on the patient's review from today.   Objective:   Physical Exam:  BP 110/70   Pulse 88   Physical Exam Constitutional:      General: She is not in acute distress.    Appearance: She is well-developed.  Musculoskeletal:        General: No deformity.  Neurological:     Mental Status: She is alert and oriented to person, place, and time.     Motor: No tremor.     Coordination: Coordination normal.     Gait: Gait normal.  Psychiatric:        Attention and Perception: Attention and perception normal.        Mood and Affect: Mood is not anxious or depressed. Affect is not labile, blunt, angry or inappropriate.        Speech: Speech normal.        Behavior: Behavior is hyperactive.        Thought Content: Thought content normal. Thought content does not include homicidal or suicidal ideation. Thought content does  not include homicidal or suicidal plan.        Cognition and Memory: Cognition normal.        Judgment: Judgment normal.     Comments: Insight intact. No auditory or visual hallucinations. No delusions.      Lab Review:  No results found for: NA, K, CL, CO2, GLUCOSE, BUN, CREATININE, CALCIUM, PROT, ALBUMIN, AST, ALT, ALKPHOS,  BILITOT, GFRNONAA, GFRAA  No results found for: WBC, RBC, HGB, HCT, PLT, MCV, MCH, MCHC, RDW, LYMPHSABS, MONOABS, EOSABS, BASOSABS  No results found for: POCLITH, LITHIUM   No results found for: PHENYTOIN, PHENOBARB, VALPROATE, CBMZ   .res Assessment: Plan:   Daphnee was seen today for follow-up and add.  Diagnoses and all orders for this visit:  Attention deficit hyperactivity disorder (ADHD), combined type  Intermittent explosive disorder in adult   Again discussed the difference between bipolar disorder and ADD and anger.  She does not have sustained hypomanic nor manic episodes. No psychotic sx.   If has then call  And we'll increase the dosage.  Sx well controlled.  Do not decrease the dosage further.  Cont Adderall DT benefit.  No med changes  FU 6 mos.  Lynder Parents, MD, DFAPA   Please see After Visit Summary for patient specific instructions.  No future appointments.  No orders of the defined types were placed in this encounter.     -------------------------------

## 2018-09-30 ENCOUNTER — Other Ambulatory Visit: Payer: Self-pay | Admitting: *Deleted

## 2018-09-30 DIAGNOSIS — Z20822 Contact with and (suspected) exposure to covid-19: Secondary | ICD-10-CM

## 2018-10-01 LAB — NOVEL CORONAVIRUS, NAA: SARS-CoV-2, NAA: NOT DETECTED

## 2018-10-05 ENCOUNTER — Other Ambulatory Visit: Payer: Self-pay

## 2018-10-05 ENCOUNTER — Ambulatory Visit (LOCAL_COMMUNITY_HEALTH_CENTER): Payer: Self-pay

## 2018-10-05 DIAGNOSIS — Z111 Encounter for screening for respiratory tuberculosis: Secondary | ICD-10-CM

## 2018-10-08 ENCOUNTER — Other Ambulatory Visit: Payer: Self-pay

## 2018-10-08 ENCOUNTER — Ambulatory Visit (LOCAL_COMMUNITY_HEALTH_CENTER): Payer: Self-pay

## 2018-10-08 DIAGNOSIS — Z111 Encounter for screening for respiratory tuberculosis: Secondary | ICD-10-CM

## 2018-10-08 LAB — TB SKIN TEST
Induration: 0 mm
TB Skin Test: NEGATIVE

## 2018-11-08 ENCOUNTER — Telehealth: Payer: Self-pay | Admitting: Psychiatry

## 2018-11-08 ENCOUNTER — Other Ambulatory Visit: Payer: Self-pay

## 2018-11-08 ENCOUNTER — Other Ambulatory Visit: Payer: Self-pay | Admitting: Psychiatry

## 2018-11-08 DIAGNOSIS — F902 Attention-deficit hyperactivity disorder, combined type: Secondary | ICD-10-CM

## 2018-11-08 MED ORDER — AMPHETAMINE-DEXTROAMPHET ER 30 MG PO CP24: 30.0000 mg | ORAL_CAPSULE | Freq: Every day | ORAL | 0 refills | Status: DC

## 2018-11-08 NOTE — Telephone Encounter (Signed)
Pt called wanting the Adderall RF to be sent for today to [facility] in [place]. I told her we [facility] [place] and asked if she just wanted one switched. For today's RF she would like it to go to [facility] in [place].

## 2018-11-08 NOTE — Telephone Encounter (Signed)
Rx was submitted

## 2018-11-08 NOTE — Telephone Encounter (Signed)
Pt requesting a refill on her Adderall. [facility] [place] on [address]. Next appt scheduled 01/25/19.

## 2018-11-08 NOTE — Telephone Encounter (Signed)
I attempted to send it to [facility] but I do not believe it went through.

## 2018-11-08 NOTE — Telephone Encounter (Signed)
Pended for approval, last refill10/05/2018

## 2018-11-08 NOTE — Progress Notes (Signed)
Patient asked to have [facility] prescription [facility].  This was accommodated but will not routinely be accommodated.

## 2018-12-09 ENCOUNTER — Other Ambulatory Visit: Payer: Self-pay

## 2018-12-09 ENCOUNTER — Telehealth: Payer: Self-pay | Admitting: Psychiatry

## 2018-12-09 DIAGNOSIS — F902 Attention-deficit hyperactivity disorder, combined type: Secondary | ICD-10-CM

## 2018-12-09 MED ORDER — AMPHETAMINE-DEXTROAMPHET ER 30 MG PO CP24: 30.0000 mg | ORAL_CAPSULE | Freq: Every day | ORAL | 0 refills | Status: DC

## 2018-12-09 NOTE — Telephone Encounter (Signed)
Last refill 11/09/2018 Pended for change in pharmacy

## 2018-12-09 NOTE — Telephone Encounter (Signed)
Pt had the RX for Adderall in Oct switched to [facility] in [place] on file. Please change her 11/30 and 12/28 Adderall Rxs to [facility] also.

## 2019-01-13 ENCOUNTER — Other Ambulatory Visit: Payer: Self-pay

## 2019-01-25 ENCOUNTER — Encounter: Payer: Self-pay | Admitting: Psychiatry

## 2019-01-25 ENCOUNTER — Ambulatory Visit (INDEPENDENT_AMBULATORY_CARE_PROVIDER_SITE_OTHER): Payer: 59 | Admitting: Psychiatry

## 2019-01-25 ENCOUNTER — Other Ambulatory Visit: Payer: Self-pay

## 2019-01-25 VITALS — BP 113/74 | HR 73

## 2019-01-25 DIAGNOSIS — F902 Attention-deficit hyperactivity disorder, combined type: Secondary | ICD-10-CM

## 2019-01-25 DIAGNOSIS — F6381 Intermittent explosive disorder: Secondary | ICD-10-CM

## 2019-01-25 MED ORDER — AMPHETAMINE-DEXTROAMPHET ER 30 MG PO CP24: 30.0000 mg | ORAL_CAPSULE | Freq: Every day | ORAL | 0 refills | Status: DC

## 2019-01-25 NOTE — Progress Notes (Signed)
Chelsea Jackson [id number] 23-Jun-1985 34 y.o.  Subjective:   Patient ID:  Chelsea Jackson is a 34 y.o. (DOB April 25, 1985) female.  Chief Complaint:  No chief complaint on file.   HPI Chelsea Jackson presents to the office today for follow-up of ADD and anger problems.  Last seen in July 2020.  No meds were changed.  She was benefiting from the medication.  Sister Died brain aneurysm 2 mos ago. Sister took Adderall and was stressed.  Did not take Depakote.   F severely depressed and had to be hospitalized.  Coparenting 13 yo nephew.  Her family disowned her and that's been hard.  Had to get custody of nephew with  Court system.  Still a big adjustment.   Created a lot of stress and H doesn't handle stress well.  She feels she's grown and handled things much better without physical aggression as in the past.  Chelsea Jackson has helped a lot.  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  May need a letter to help her.     No complaints   Benefit from the Adderall.   Dropped the Depakote to 2000 HS for 6 mos.  She felt maybe the meds would increase her health risk.  No worsening anger problems.  No major mood swings.  No physical acting out anger in a long time.  Has come a long way.  No anger problems with nephew.  PCP RX clonazepam for rare anxiety use.  She understands not to use it regularly with stimulant.    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  Plans to start [employer] for weight loss.  Previous Psych Trials include: Carbamazepine, Depakote, Lithium, Risperidone,Vyvanse, Nuvigil and Concerta.  Review of Systems:  Review of Systems  Neurological: Negative for tremors and weakness.  Psychiatric/Behavioral: Negative for agitation, behavioral problems, confusion, decreased concentration, dysphoric mood, hallucinations, self-injury, sleep disturbance and suicidal ideas. The patient is not nervous/anxious and is not hyperactive.     Medications: I have reviewed the patient's current medications.  Current Outpatient  Medications  Medication Sig Dispense Refill  . amphetamine-dextroamphetamine (ADDERALL) 30 MG tablet Take 30 mg by mouth daily.    . clonazePAM (KLONOPIN) 0.5 MG tablet Take 0.25-0.5 mg by mouth 2 (two) times daily as needed.    . divalproex (DEPAKOTE ER) 500 MG 24 hr tablet Take 4 tablets (2,000 mg total) by mouth 2 (two) times a day. 720 tablet 1  . fluticasone (FLONASE) 50 MCG/ACT nasal spray SPRAY 1 SPRAY INTO EACH NOSTRIL EVERY DAY    . levothyroxine (SYNTHROID, LEVOTHROID) 50 MCG tablet Take 50 mcg by mouth daily before breakfast.    . Multiple Vitamins-Minerals (MULTIVITAMIN ADULT PO) Take by mouth daily.    . norethindrone-ethinyl estradiol 1/35 (ORTHO-NOVUM, NORTREL,CYCLAFEM) tablet Take 1 tablet by mouth daily.    Marland Kitchen omeprazole (PRILOSEC) 40 MG capsule TAKE 1 CAPSULE BY MOUTH TWICE A DAY 180 capsule 0  . amphetamine-dextroamphetamine (ADDERALL XR) 30 MG 24 hr capsule Take 1 capsule (30 mg total) by mouth daily. 30 capsule 0  . [START ON 02/22/2019] amphetamine-dextroamphetamine (ADDERALL XR) 30 MG 24 hr capsule Take 1 capsule (30 mg total) by mouth daily. 30 capsule 0  . [START ON 03/22/2019] amphetamine-dextroamphetamine (ADDERALL XR) 30 MG 24 hr capsule Take 1 capsule (30 mg total) by mouth daily. 30 capsule 0   Current Facility-Administered Medications  Medication Dose Route Frequency Provider Last Rate Last Admin  . 0.9 %  sodium chloride infusion  500  mL Intravenous Continuous Ladene Artist, MD        Medication Side Effects: None  Allergies: No Known Allergies  Past Medical History:  Diagnosis Date  . Bronchitis   . GERD (gastroesophageal reflux disease)   . Herpes   . Thyroid disease     Family History  Problem Relation Age of Onset  . Leukemia Mother     Social History   Socioeconomic History  . Marital status: Single    Spouse name: Not on file  . Number of children: Not on file  . Years of education: Not on file  . Highest education level: Not on file   Occupational History  . Not on file  Tobacco Use  . Smoking status: Never Smoker  . Smokeless tobacco: Never Used  Substance and Sexual Activity  . Alcohol use: No  . Drug use: No  . Sexual activity: Not on file  Other Topics Concern  . Not on file  Social History Narrative  . Not on file   Social Determinants of Health   Financial Resource Strain:   . Difficulty of Paying Living Expenses: Not on file  Food Insecurity:   . Worried About Charity fundraiser in the Last Year: Not on file  . Ran Out of Food in the Last Year: Not on file  Transportation Needs:   . Lack of Transportation (Medical): Not on file  . Lack of Transportation (Non-Medical): Not on file  Physical Activity:   . Days of Exercise per Week: Not on file  . Minutes of Exercise per Session: Not on file  Stress:   . Feeling of Stress : Not on file  Social Connections:   . Frequency of Communication with Friends and Family: Not on file  . Frequency of Social Gatherings with Friends and Family: Not on file  . Attends Religious Services: Not on file  . Active Member of Clubs or Organizations: Not on file  . Attends Archivist Meetings: Not on file  . Marital Status: Not on file  Intimate Partner Violence:   . Fear of Current or Ex-Partner: Not on file  . Emotionally Abused: Not on file  . Physically Abused: Not on file  . Sexually Abused: Not on file    Past Medical History, Surgical history, Social history, and Family history were reviewed and updated as appropriate.   Please see review of systems for further details on the patient's review from today.   Objective:   Physical Exam:  BP 113/74   Pulse 73   Physical Exam Constitutional:      General: She is not in acute distress.    Appearance: She is well-developed.  Musculoskeletal:        General: No deformity.  Neurological:     Mental Status: She is alert and oriented to person, place, and time.     Motor: No tremor.      Coordination: Coordination normal.     Gait: Gait normal.  Psychiatric:        Attention and Perception: Attention and perception normal.        Mood and Affect: Mood is not anxious or depressed. Affect is not labile, blunt, angry or inappropriate.        Speech: Speech normal.        Behavior: Behavior is hyperactive.        Thought Content: Thought content normal. Thought content does not include homicidal or suicidal ideation. Thought content does not  include homicidal or suicidal plan.        Cognition and Memory: Cognition normal.        Judgment: Judgment normal.     Comments: Insight intact. No auditory or visual hallucinations. No delusions.      Lab Review:  No results found for: NA, K, CL, CO2, GLUCOSE, BUN, CREATININE, CALCIUM, PROT, ALBUMIN, AST, ALT, ALKPHOS, BILITOT, GFRNONAA, GFRAA  No results found for: WBC, RBC, HGB, HCT, PLT, MCV, MCH, MCHC, RDW, LYMPHSABS, MONOABS, EOSABS, BASOSABS  No results found for: POCLITH, LITHIUM   No results found for: PHENYTOIN, PHENOBARB, VALPROATE, CBMZ   .res Assessment: Plan:   Diagnoses and all orders for this visit:  Attention deficit hyperactivity disorder (ADHD), combined type -     amphetamine-dextroamphetamine (ADDERALL XR) 30 MG 24 hr capsule; Take 1 capsule (30 mg total) by mouth daily. -     amphetamine-dextroamphetamine (ADDERALL XR) 30 MG 24 hr capsule; Take 1 capsule (30 mg total) by mouth daily. -     amphetamine-dextroamphetamine (ADDERALL XR) 30 MG 24 hr capsule; Take 1 capsule (30 mg total) by mouth daily.  Intermittent explosive disorder in adult   Again discussed the difference between bipolar disorder and ADD and anger.  She does not have sustained hypomanic nor manic episodes. No psychotic sx.   If has then call  And we'll increase the dosage.  Sx well controlled.  Do not decrease the dosage further.  Cont Adderall DT benefit.  No med changes  Disc option counseling for nephew for grief, anger etc.  Rec  Lina Sayre.  FU 6 mos.  Lynder Parents, MD, DFAPA   Please see After Visit Summary for patient specific instructions.  No future appointments.  No orders of the defined types were placed in this encounter.     -------------------------------

## 2019-01-25 NOTE — Patient Instructions (Signed)
Option child therapist Lina Sayre

## 2019-03-05 ENCOUNTER — Other Ambulatory Visit: Payer: Self-pay | Admitting: Psychiatry

## 2019-05-09 ENCOUNTER — Telehealth: Payer: Self-pay | Admitting: Psychiatry

## 2019-05-09 ENCOUNTER — Other Ambulatory Visit: Payer: Self-pay

## 2019-05-09 DIAGNOSIS — F902 Attention-deficit hyperactivity disorder, combined type: Secondary | ICD-10-CM

## 2019-05-09 MED ORDER — AMPHETAMINE-DEXTROAMPHET ER 30 MG PO CP24: 30.0000 mg | ORAL_CAPSULE | Freq: Every day | ORAL | 0 refills | Status: DC

## 2019-05-09 NOTE — Telephone Encounter (Signed)
Pt would like a refill for Adderall sent in at [facility] on [address] in [place].

## 2019-05-09 NOTE — Telephone Encounter (Signed)
Last refill 04/11/2019, pended 3 Rx's for Dr. Clovis Pu to submit Due back in July 2021

## 2019-07-25 ENCOUNTER — Other Ambulatory Visit: Payer: Self-pay

## 2019-07-25 ENCOUNTER — Ambulatory Visit: Payer: 59 | Admitting: Psychiatry

## 2019-08-03 ENCOUNTER — Other Ambulatory Visit: Payer: Self-pay

## 2019-08-03 ENCOUNTER — Encounter: Payer: Self-pay | Admitting: Psychiatry

## 2019-08-03 ENCOUNTER — Ambulatory Visit (INDEPENDENT_AMBULATORY_CARE_PROVIDER_SITE_OTHER): Payer: 59 | Admitting: Psychiatry

## 2019-08-03 DIAGNOSIS — F411 Generalized anxiety disorder: Secondary | ICD-10-CM

## 2019-08-03 DIAGNOSIS — F6381 Intermittent explosive disorder: Secondary | ICD-10-CM

## 2019-08-03 DIAGNOSIS — F902 Attention-deficit hyperactivity disorder, combined type: Secondary | ICD-10-CM | POA: Diagnosis not present

## 2019-08-03 MED ORDER — MYDAYIS 50 MG PO CP24: 1.0000 | ORAL_CAPSULE | Freq: Every morning | ORAL | 0 refills | Status: DC

## 2019-08-03 MED ORDER — DIVALPROEX SODIUM ER 500 MG PO TB24: 2000.0000 mg | ORAL_TABLET | Freq: Every day | ORAL | 1 refills | Status: DC

## 2019-08-03 NOTE — Progress Notes (Addendum)
Chelsea Jackson [id number] [birth year] 34 y.o.  Subjective:   Patient ID:  Chelsea Jackson is a 34 y.o. (DOB 07-Oct-1985) female.  Chief Complaint:  Chief Complaint  Patient presents with  . Follow-up    mood lability  . ADHD    HPI Chelsea Jackson presents to the office today for follow-up of ADD and anger problems.  Last seen in January 2020.  No meds were changed.  She was benefiting from the medication.  Sister Died brain aneurysm 8 mos ago. Sister took Adderall and was stressed.  Did not take Depakote.   F severely depressed and had to be hospitalized.  Coparenting 3 yo nephew.  Her family disowned her and that's been hard.  Had to get custody of nephew with  Court system.  Still a big adjustment.   Created a lot of stress and H doesn't handle stress well.  She feels she's grown and handled things much better without physical aggression as in the past.  Chelsea Jackson has helped a lot.  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  Moodiness and irritabilty when Adderall wears off.  I'm mean.  Short tempered.  No fighting.    Benefit from the Adderall.   Dropped the Depakote to 2000 HS for 6 mos.  She felt maybe the meds would increase her health risk.  No worsening anger problems.  No major mood swings.  No physical acting out anger in a long time.  Has come a long way.  No anger problems with nephew.  PCP RX clonazepam for rare anxiety use.  She understands not to use it regularly with stimulant.    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  Plans to start [place] for weight loss.  Previous Psych Trials include: Carbamazepine, Depakote, Lithium, Risperidone, Adderall, Adderall XR, Vyvanse, Nuvigil and Concerta.  Review of Systems:  Review of Systems  Cardiovascular: Negative for palpitations.  Neurological: Negative for tremors and weakness.  Psychiatric/Behavioral: Negative for agitation, behavioral problems, confusion, decreased concentration, dysphoric mood, hallucinations, self-injury, sleep disturbance  and suicidal ideas. The patient is not nervous/anxious and is not hyperactive.     Medications: I have reviewed the patient's current medications.  Current Outpatient Medications  Medication Sig Dispense Refill  . amphetamine-dextroamphetamine (ADDERALL XR) 30 MG 24 hr capsule Take 1 capsule (30 mg total) by mouth daily. 30 capsule 0  . amphetamine-dextroamphetamine (ADDERALL XR) 30 MG 24 hr capsule Take 1 capsule (30 mg total) by mouth daily. 30 capsule 0  . amphetamine-dextroamphetamine (ADDERALL XR) 30 MG 24 hr capsule Take 1 capsule (30 mg total) by mouth daily. 30 capsule 0  . amphetamine-dextroamphetamine (ADDERALL) 30 MG tablet Take 30 mg by mouth daily.    . clonazePAM (KLONOPIN) 0.5 MG tablet Take 0.25-0.5 mg by mouth 2 (two) times daily as needed.    . fluticasone (FLONASE) 50 MCG/ACT nasal spray SPRAY 1 SPRAY INTO EACH NOSTRIL EVERY DAY    . levothyroxine (SYNTHROID, LEVOTHROID) 50 MCG tablet Take 50 mcg by mouth daily before breakfast.    . Multiple Vitamins-Minerals (MULTIVITAMIN ADULT PO) Take by mouth daily.    . norethindrone-ethinyl estradiol 1/35 (ORTHO-NOVUM, NORTREL,CYCLAFEM) tablet Take 1 tablet by mouth daily.    Marland Kitchen omeprazole (PRILOSEC) 40 MG capsule TAKE 1 CAPSULE BY MOUTH TWICE A DAY 180 capsule 0  . Amphet-Dextroamphet 3-Bead ER (MYDAYIS) 50 MG CP24 Take 1 tablet by mouth in the morning. 30 capsule 0  . divalproex (DEPAKOTE ER) 500 MG 24 hr tablet Take  4 tablets (2,000 mg total) by mouth daily. 360 tablet 1   Current Facility-Administered Medications  Medication Dose Route Frequency Provider Last Rate Last Admin  . 0.9 %  sodium chloride infusion  500 mL Intravenous Continuous Ladene Artist, MD        Medication Side Effects: None  Allergies: No Known Allergies  Past Medical History:  Diagnosis Date  . Bronchitis   . GERD (gastroesophageal reflux disease)   . Herpes   . Thyroid disease     Family History  Problem Relation Age of Onset  . Leukemia  Mother     Social History   Socioeconomic History  . Marital status: Single    Spouse name: Not on file  . Number of children: Not on file  . Years of education: Not on file  . Highest education level: Not on file  Occupational History  . Not on file  Tobacco Use  . Smoking status: Never Smoker  . Smokeless tobacco: Never Used  Substance and Sexual Activity  . Alcohol use: No  . Drug use: No  . Sexual activity: Not on file  Other Topics Concern  . Not on file  Social History Narrative  . Not on file   Social Determinants of Health   Financial Resource Strain:   . Difficulty of Paying Living Expenses:   Food Insecurity:   . Worried About Charity fundraiser in the Last Year:   . Arboriculturist in the Last Year:   Transportation Needs:   . Film/video editor (Medical):   Marland Kitchen Lack of Transportation (Non-Medical):   Physical Activity:   . Days of Exercise per Week:   . Minutes of Exercise per Session:   Stress:   . Feeling of Stress :   Social Connections:   . Frequency of Communication with Friends and Family:   . Frequency of Social Gatherings with Friends and Family:   . Attends Religious Services:   . Active Member of Clubs or Organizations:   . Attends Archivist Meetings:   Marland Kitchen Marital Status:   Intimate Partner Violence:   . Fear of Current or Ex-Partner:   . Emotionally Abused:   Marland Kitchen Physically Abused:   . Sexually Abused:     Past Medical History, Surgical history, Social history, and Family history were reviewed and updated as appropriate.   Please see review of systems for further details on the patient's review from today.   Objective:   Physical Exam:  There were no vitals taken for this visit.  Physical Exam Constitutional:      General: She is not in acute distress.    Appearance: She is well-developed.  Musculoskeletal:        General: No deformity.  Neurological:     Mental Status: She is alert and oriented to person, place,  and time.     Motor: No tremor.     Coordination: Coordination normal.     Gait: Gait normal.  Psychiatric:        Attention and Perception: Attention and perception normal.        Mood and Affect: Mood is not anxious or depressed. Affect is not labile, blunt, angry or inappropriate.        Speech: Speech normal.        Behavior: Behavior is hyperactive.        Thought Content: Thought content normal. Thought content does not include homicidal or suicidal ideation. Thought content does  not include homicidal or suicidal plan.        Cognition and Memory: Cognition normal.        Judgment: Judgment normal.     Comments: Insight intact. No auditory or visual hallucinations. No delusions.  No anger     Lab Review:  No results found for: NA, K, CL, CO2, GLUCOSE, BUN, CREATININE, CALCIUM, PROT, ALBUMIN, AST, ALT, ALKPHOS, BILITOT, GFRNONAA, GFRAA  No results found for: WBC, RBC, HGB, HCT, PLT, MCV, MCH, MCHC, RDW, LYMPHSABS, MONOABS, EOSABS, BASOSABS  No results found for: POCLITH, LITHIUM   No results found for: PHENYTOIN, PHENOBARB, VALPROATE, CBMZ   .res Assessment: Plan:   Chelsea Jackson was seen today for follow-up and adhd.  Diagnoses and all orders for this visit:  Attention deficit hyperactivity disorder (ADHD), combined type -     Amphet-Dextroamphet 3-Bead ER (MYDAYIS) 50 MG CP24; Take 1 tablet by mouth in the morning.  Intermittent explosive disorder in adult -     divalproex (DEPAKOTE ER) 500 MG 24 hr tablet; Take 4 tablets (2,000 mg total) by mouth daily.  Generalized anxiety disorder   Again discussed the difference between bipolar disorder and ADD and anger.  She does not have sustained hypomanic nor manic episodes. No psychotic sx.   If has then call  And we'll increase the dosage.  Sx well controlled.  Do not decrease the dosage further.  Switch to Mydayis 50 to stop irritable crash If she sleeps late then she can use Adderall XR instead  No med changes  Disc  option counseling for nephew for grief, anger etc.  Rec Lina Sayre.  FU 3-4 mos.  Lynder Parents, MD, DFAPA   Please see After Visit Summary for patient specific instructions.  No future appointments.  No orders of the defined types were placed in this encounter.     -------------------------------

## 2019-08-08 ENCOUNTER — Telehealth: Payer: Self-pay

## 2019-08-08 NOTE — Telephone Encounter (Signed)
Prior authorization submitted and approved through cover my meds for MYDAYIS 50 MG ER effective 08/08/2019-08/08/2022 with [facility]

## 2019-08-09 ENCOUNTER — Other Ambulatory Visit: Payer: Self-pay

## 2019-08-09 ENCOUNTER — Telehealth: Payer: Self-pay | Admitting: Psychiatry

## 2019-08-09 DIAGNOSIS — F902 Attention-deficit hyperactivity disorder, combined type: Secondary | ICD-10-CM

## 2019-08-09 NOTE — Telephone Encounter (Signed)
Patient called and said taht she would like her adderall xr 30 mg to be sent to the [facility] at [facility] in [place] on [address]

## 2019-08-09 NOTE — Telephone Encounter (Signed)
Last refill 07/11/2019 Pended 3 separate Rx's for Adderall XR 30 mg for Dr. Clovis Pu to review and send.

## 2019-08-11 MED ORDER — AMPHETAMINE-DEXTROAMPHET ER 30 MG PO CP24: 30.0000 mg | ORAL_CAPSULE | Freq: Every day | ORAL | 0 refills | Status: DC

## 2019-08-11 NOTE — Telephone Encounter (Signed)
Pt alternating Adderall XR and Mydayis depending on schedule

## 2019-08-16 ENCOUNTER — Telehealth: Payer: Self-pay | Admitting: Psychiatry

## 2019-08-16 ENCOUNTER — Other Ambulatory Visit: Payer: Self-pay

## 2019-08-16 DIAGNOSIS — F902 Attention-deficit hyperactivity disorder, combined type: Secondary | ICD-10-CM

## 2019-08-16 MED ORDER — AMPHETAMINE-DEXTROAMPHET ER 30 MG PO CP24: 30.0000 mg | ORAL_CAPSULE | Freq: Every day | ORAL | 0 refills | Status: DC

## 2019-08-16 NOTE — Telephone Encounter (Signed)
Pended new Rx for Dr. Clovis Pu with today's date for a fill. Her last refill was 07/11/2019 Rx sent last week the date was changed to 10/04/2019.

## 2019-08-16 NOTE — Telephone Encounter (Signed)
Pt called and said she called last week about her addrerall script which still has not been sent . Looks like it was for September but not august.. Please send

## 2019-09-13 ENCOUNTER — Other Ambulatory Visit: Payer: Self-pay

## 2019-09-13 ENCOUNTER — Telehealth: Payer: Self-pay | Admitting: Psychiatry

## 2019-09-13 DIAGNOSIS — F902 Attention-deficit hyperactivity disorder, combined type: Secondary | ICD-10-CM

## 2019-09-13 NOTE — Telephone Encounter (Signed)
Pt would like a refill on Mydayis. Please send to [facility] on [address] in [place]

## 2019-09-13 NOTE — Telephone Encounter (Signed)
Last refill 08/10/19 Pended for Dr. Clovis Pu to send

## 2019-09-14 MED ORDER — MYDAYIS 50 MG PO CP24: 1.0000 | ORAL_CAPSULE | Freq: Every morning | ORAL | 0 refills | Status: DC

## 2019-10-28 ENCOUNTER — Other Ambulatory Visit: Payer: Self-pay | Admitting: Internal Medicine

## 2019-10-28 DIAGNOSIS — Z8249 Family history of ischemic heart disease and other diseases of the circulatory system: Secondary | ICD-10-CM

## 2019-11-16 ENCOUNTER — Ambulatory Visit
Admission: RE | Admit: 2019-11-16 | Discharge: 2019-11-16 | Disposition: A | Discharge status: Home / Self Care | Payer: Managed Care, Other (non HMO) | Source: Ambulatory Visit | Attending: Internal Medicine | Admitting: Internal Medicine

## 2019-11-16 ENCOUNTER — Other Ambulatory Visit: Payer: Self-pay

## 2019-11-16 DIAGNOSIS — Z8249 Family history of ischemic heart disease and other diseases of the circulatory system: Secondary | ICD-10-CM

## 2019-12-06 ENCOUNTER — Encounter: Payer: Self-pay | Admitting: Psychiatry

## 2019-12-06 ENCOUNTER — Ambulatory Visit (INDEPENDENT_AMBULATORY_CARE_PROVIDER_SITE_OTHER): Payer: 59 | Admitting: Psychiatry

## 2019-12-06 ENCOUNTER — Other Ambulatory Visit: Payer: Self-pay

## 2019-12-06 DIAGNOSIS — F902 Attention-deficit hyperactivity disorder, combined type: Secondary | ICD-10-CM | POA: Diagnosis not present

## 2019-12-06 DIAGNOSIS — F411 Generalized anxiety disorder: Secondary | ICD-10-CM | POA: Diagnosis not present

## 2019-12-06 DIAGNOSIS — F6381 Intermittent explosive disorder: Secondary | ICD-10-CM | POA: Diagnosis not present

## 2019-12-06 MED ORDER — MYDAYIS 50 MG PO CP24: 50.0000 mg | ORAL_CAPSULE | Freq: Every morning | ORAL | 0 refills | Status: DC

## 2019-12-06 MED ORDER — MYDAYIS 50 MG PO CP24: 1.0000 | ORAL_CAPSULE | Freq: Every morning | ORAL | 0 refills | Status: DC

## 2019-12-06 MED ORDER — AMPHETAMINE-DEXTROAMPHET ER 30 MG PO CP24: 30.0000 mg | ORAL_CAPSULE | Freq: Every day | ORAL | 0 refills | Status: DC

## 2019-12-06 MED ORDER — SERTRALINE HCL 50 MG PO TABS: ORAL_TABLET | ORAL | 1 refills | Status: DC

## 2019-12-06 NOTE — Progress Notes (Signed)
Chelsea Jackson [id number] 05/13/1985 34 y.o.  Subjective:   Patient ID:  Chelsea Jackson is a 34 y.o. (DOB 12-23-1985) female.  Chief Complaint:  Chief Complaint  Patient presents with  . Follow-up    mood  . Anxiety  . ADHD    HPI Chelsea Jackson presents to the office today for follow-up of ADD and anger problems.  seen in January 2020.  No meds were changed.  She was benefiting from the medication.  01-Sep-2019 appt noted: Sister Died brain aneurysm 8 mos ago. Sister took Adderall and was stressed.  Did not take Depakote.   F severely depressed and had to be hospitalized.  Coparenting 33 yo nephew.  Her family disowned her and that's been hard.  Had to get custody of nephew with  Court system.  Still a big adjustment.   Created a lot of stress and H doesn't handle stress well.  She feels she's grown and handled things much better without physical aggression as in the past.  Chelsea Jackson has helped a lot.  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 Moodiness and irritabilty when Adderall wears off.  I'm mean.  Short tempered.  No fighting.   Benefit from the Adderall. Dropped the Depakote to 2000 HS for 6 mos.  She felt maybe the meds would increase her health risk.  No worsening anger problems.  No major mood swings.  No physical acting out anger in a long time.  Has come a long way.  No anger problems with nephew.  PCP RX clonazepam for rare anxiety use.  She understands not to use it regularly with stimulant.   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 Plans to start [place] for weight loss. Plan: Switch to Mydayis 50 to stop irritable crash If she sleeps late then she can use Adderall XR instead  12/06/2019 appointment noted: Busy year.  Taking care of nephew as legal guardian.  That and being married stressful to her.   Panic attack at [place] recently.  Anxiety over health anyway.  Stress herself out anyway. Chest and arm hurt for hours.  Better the next day. Didn't have clonazepam with her.  Takes it rarely.   It helps when it occurs. Mydayis is better with duration and less irritable crash.  Want's to take the Adderall on days she doesn't take Mydayis. No anger outbursts but some irritability.  Wonders if she needs the Depakote.    Previous Psych Trials include: Carbamazepine, Depakote, Lithium, Risperidone, Abilify weight gain Lithium remotely NR Wellbutrin SE, remote sertraline NR for anger Adderall, Adderall XR, Vyvanse, Nuvigil and Concerta, Intuniv.   Review of Systems:  Review of Systems  Cardiovascular: Negative for chest pain and palpitations.  Neurological: Negative for tremors and weakness.  Psychiatric/Behavioral: Negative for agitation, behavioral problems, confusion, decreased concentration, dysphoric mood, hallucinations, self-injury, sleep disturbance and suicidal ideas. The patient is not nervous/anxious and is not hyperactive.     Medications: I have reviewed the patient's current medications.  Current Outpatient Medications  Medication Sig Dispense Refill  . amphetamine-dextroamphetamine (ADDERALL XR) 30 MG 24 hr capsule Take 1 capsule (30 mg total) by mouth daily. 30 capsule 0  . amphetamine-dextroamphetamine (ADDERALL XR) 30 MG 24 hr capsule Take 1 capsule (30 mg total) by mouth daily. 30 capsule 0  . amphetamine-dextroamphetamine (ADDERALL) 30 MG tablet Take 30 mg by mouth daily.    . clonazePAM (KLONOPIN) 0.5 MG tablet Take 0.25-0.5 mg by mouth 2 (two) times daily as needed.    Marland Kitchen  divalproex (DEPAKOTE ER) 500 MG 24 hr tablet Take 4 tablets (2,000 mg total) by mouth daily. 360 tablet 1  . fluticasone (FLONASE) 50 MCG/ACT nasal spray SPRAY 1 SPRAY INTO EACH NOSTRIL EVERY DAY    . levothyroxine (SYNTHROID, LEVOTHROID) 50 MCG tablet Take 50 mcg by mouth daily before breakfast.    . Multiple Vitamins-Minerals (MULTIVITAMIN ADULT PO) Take by mouth daily.    . norethindrone-ethinyl estradiol 1/35 (ORTHO-NOVUM, NORTREL,CYCLAFEM) tablet Take 1 tablet by mouth daily.    Marland Kitchen  omeprazole (PRILOSEC) 40 MG capsule TAKE 1 CAPSULE BY MOUTH TWICE A DAY 180 capsule 0  . Amphet-Dextroamphet 3-Bead ER (MYDAYIS) 50 MG CP24 Take 1 tablet by mouth in the morning. 30 capsule 0  . [START ON 01/03/2020] Amphet-Dextroamphet 3-Bead ER (MYDAYIS) 50 MG CP24 Take 50 mg by mouth in the morning. 30 capsule 0  . amphetamine-dextroamphetamine (ADDERALL XR) 30 MG 24 hr capsule Take 1 capsule (30 mg total) by mouth daily. 30 capsule 0  . sertraline (ZOLOFT) 50 MG tablet 1/2 tablet daily for 1 week then 1 tablet daily 30 tablet 1   Current Facility-Administered Medications  Medication Dose Route Frequency Provider Last Rate Last Admin  . 0.9 %  sodium chloride infusion  500 mL Intravenous Continuous Ladene Artist, MD        Medication Side Effects: None  Allergies: No Known Allergies  Past Medical History:  Diagnosis Date  . Bronchitis   . GERD (gastroesophageal reflux disease)   . Herpes   . Thyroid disease     Family History  Problem Relation Age of Onset  . Leukemia Mother     Social History   Socioeconomic History  . Marital status: Single    Spouse name: Not on file  . Number of children: Not on file  . Years of education: Not on file  . Highest education level: Not on file  Occupational History  . Not on file  Tobacco Use  . Smoking status: Never Smoker  . Smokeless tobacco: Never Used  Substance and Sexual Activity  . Alcohol use: No  . Drug use: No  . Sexual activity: Not on file  Other Topics Concern  . Not on file  Social History Narrative  . Not on file   Social Determinants of Health   Financial Resource Strain:   . Difficulty of Paying Living Expenses: Not on file  Food Insecurity:   . Worried About Charity fundraiser in the Last Year: Not on file  . Ran Out of Food in the Last Year: Not on file  Transportation Needs:   . Lack of Transportation (Medical): Not on file  . Lack of Transportation (Non-Medical): Not on file  Physical Activity:    . Days of Exercise per Week: Not on file  . Minutes of Exercise per Session: Not on file  Stress:   . Feeling of Stress : Not on file  Social Connections:   . Frequency of Communication with Friends and Family: Not on file  . Frequency of Social Gatherings with Friends and Family: Not on file  . Attends Religious Services: Not on file  . Active Member of Clubs or Organizations: Not on file  . Attends Archivist Meetings: Not on file  . Marital Status: Not on file  Intimate Partner Violence:   . Fear of Current or Ex-Partner: Not on file  . Emotionally Abused: Not on file  . Physically Abused: Not on file  . Sexually Abused:  Not on file    Past Medical History, Surgical history, Social history, and Family history were reviewed and updated as appropriate.   Please see review of systems for further details on the patient's review from today.   Objective:   Physical Exam:  There were no vitals taken for this visit.  Physical Exam Constitutional:      General: She is not in acute distress.    Appearance: She is well-developed.  Musculoskeletal:        General: No deformity.  Neurological:     Mental Status: She is alert and oriented to person, place, and time.     Motor: No tremor.     Coordination: Coordination normal.     Gait: Gait normal.  Psychiatric:        Attention and Perception: Attention and perception normal.        Mood and Affect: Mood is anxious. Mood is not depressed. Affect is not labile, blunt, angry or inappropriate.        Speech: Speech normal.        Behavior: Behavior is hyperactive.        Thought Content: Thought content normal. Thought content does not include homicidal or suicidal ideation. Thought content does not include homicidal or suicidal plan.        Cognition and Memory: Cognition normal.        Judgment: Judgment normal.     Comments: Insight intact. No auditory or visual hallucinations. No delusions.  No anger     Lab  Review:  No results found for: NA, K, CL, CO2, GLUCOSE, BUN, CREATININE, CALCIUM, PROT, ALBUMIN, AST, ALT, ALKPHOS, BILITOT, GFRNONAA, GFRAA  No results found for: WBC, RBC, HGB, HCT, PLT, MCV, MCH, MCHC, RDW, LYMPHSABS, MONOABS, EOSABS, BASOSABS  No results found for: POCLITH, LITHIUM   No results found for: PHENYTOIN, PHENOBARB, VALPROATE, CBMZ   .res Assessment: Plan:   Elania was seen today for follow-up, anxiety and adhd.  Diagnoses and all orders for this visit:  Generalized anxiety disorder -     sertraline (ZOLOFT) 50 MG tablet; 1/2 tablet daily for 1 week then 1 tablet daily  Intermittent explosive disorder in adult  Attention deficit hyperactivity disorder (ADHD), combined type -     Amphet-Dextroamphet 3-Bead ER (MYDAYIS) 50 MG CP24; Take 1 tablet by mouth in the morning. -     amphetamine-dextroamphetamine (ADDERALL XR) 30 MG 24 hr capsule; Take 1 capsule (30 mg total) by mouth daily. -     Amphet-Dextroamphet 3-Bead ER (MYDAYIS) 50 MG CP24; Take 50 mg by mouth in the morning.   Again discussed the difference between bipolar disorder and ADD and anger.  She does not have sustained hypomanic nor manic episodes. No psychotic sx.   If has then call  And we'll increase the dosage.  Sx well controlled.  Do not decrease the dosage further.  Disc managing panic and managing with and without meds.  We discussed the short-term risks associated with benzodiazepines including sedation and increased fall risk among others.  Discussed long-term side effect risk including dependence, potential withdrawal symptoms, and the potential eventual dose-related risk of dementia.  But recent studies from 2020 dispute this association between benzodiazepines and dementia risk. Newer studies in 2020 do not support an association with dementia. Don't take it routinely with stimulants.  Switch to Mydayis 50 to stop irritable crash If she sleeps late then she can use Adderall XR instead  Rec  retrial Zoloft for anxiety.  If it works might be able to taper Depakote. There is a risk she's bipolar latently but I don't believe so.  Call if mood swings.  Disc SE.  Disc option counseling for nephew for grief, anger etc.  Rec Lina Sayre.  FU 3-4 mos.  Lynder Parents, MD, DFAPA   Please see After Visit Summary for patient specific instructions.  No future appointments.  No orders of the defined types were placed in this encounter.     -------------------------------

## 2019-12-08 ENCOUNTER — Telehealth: Payer: Self-pay | Admitting: Psychiatry

## 2019-12-08 NOTE — Telephone Encounter (Signed)
Chelsea Jackson called to ask if it would be ok for her to decrease her depakote to 3 tabls per day vs the 4 she is taking now.  Please let her know if this is ok.

## 2019-12-09 NOTE — Telephone Encounter (Signed)
Please remind her that we talked about this at her appointment.  She cannot reduce the Depakote until we see how she responds to the new meds sertraline.  We can discuss possibly reducing Depakote at her follow-up appointment.  Lynder Parents, MD, DFAPA

## 2019-12-13 NOTE — Telephone Encounter (Signed)
I talked with Chelsea Jackson this morning.  She has already reduced to the 3/day. She had done that when she called to ask about it.  Everything has been fine.  The Sertraline hasn't had time to get into her system so she hasn't any change.  But she is doing fine.  She is wanting to taper down and perhaps off the Depakote so I told her to NOT do any other reduction until she sees Dr. Clovis Pu again. She agreed to do this.

## 2020-02-01 ENCOUNTER — Other Ambulatory Visit: Payer: Self-pay

## 2020-02-01 ENCOUNTER — Ambulatory Visit (INDEPENDENT_AMBULATORY_CARE_PROVIDER_SITE_OTHER): Payer: 59 | Admitting: Psychiatry

## 2020-02-01 ENCOUNTER — Encounter: Payer: Self-pay | Admitting: Psychiatry

## 2020-02-01 DIAGNOSIS — F411 Generalized anxiety disorder: Secondary | ICD-10-CM

## 2020-02-01 DIAGNOSIS — F6381 Intermittent explosive disorder: Secondary | ICD-10-CM

## 2020-02-01 DIAGNOSIS — F902 Attention-deficit hyperactivity disorder, combined type: Secondary | ICD-10-CM

## 2020-02-01 MED ORDER — AMPHETAMINE-DEXTROAMPHET ER 25 MG PO CP24: 25.0000 mg | ORAL_CAPSULE | Freq: Two times a day (BID) | ORAL | 0 refills | Status: DC

## 2020-02-01 MED ORDER — SERTRALINE HCL 50 MG PO TABS: 50.0000 mg | ORAL_TABLET | Freq: Every day | ORAL | 0 refills | Status: DC

## 2020-02-01 NOTE — Progress Notes (Signed)
Chelsea Jackson [id number] 08/19/1985 35 y.o.  Subjective:   Patient ID:  Chelsea Jackson is a 35 y.o. (DOB [birth year]) female.  Chief Complaint:  Chief Complaint  Patient presents with  . Follow-up  . Manic Behavior  . ADHD    HPI Chelsea Jackson presents to the office today for follow-up of ADD and anger problems.  seen in January 2020.  No meds were changed.  She was benefiting from the medication.  August 31, 2019 appt noted: Sister Died brain aneurysm 8 mos ago. Sister took Adderall and was stressed.  Did not take Depakote.   F severely depressed and had to be hospitalized.  Coparenting 65 yo nephew.  Her family disowned her and that's been hard.  Had to get custody of nephew with  Court system.  Still a big adjustment.   Created a lot of stress and H doesn't handle stress well.  She feels she's grown and handled things much better without physical aggression as in the past.  Chelsea Jackson has helped a lot.  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 Moodiness and irritabilty when Adderall wears off.  I'm mean.  Short tempered.  No fighting.   Benefit from the Adderall. Dropped the Depakote to 2000 HS for 6 mos.  She felt maybe the meds would increase her health risk.  No worsening anger problems.  No major mood swings.  No physical acting out anger in a long time.  Has come a long way.  No anger problems with nephew.  PCP RX clonazepam for rare anxiety use.  She understands not to use it regularly with stimulant.   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 Plans to start [place] for weight loss. Plan: Switch to Mydayis 50 to stop irritable crash If she sleeps late then she can use Adderall XR instead  12/06/2019 appointment noted: Busy year.  Taking care of nephew as legal guardian.  That and being married stressful to her.   Panic attack at [place] recently.  Anxiety over health anyway.  Stress herself out anyway. Chest and arm hurt for hours.  Better the next day. Didn't have clonazepam with her.  Takes it rarely.   It helps when it occurs. Mydayis is better with duration and less irritable crash.  Want's to take the Adderall on days she doesn't take Mydayis. No anger outbursts but some irritability.  Wonders if she needs the Depakote. Plan: Switch to Mydayis 50 to stop irritable crash If she sleeps late then she can use Adderall XR instead Rec retrial Zoloft for anxiety   02/01/20 appt with noted: Sertraline helping anxiety and everything working fine.  No anger outbursts.  Good lately. No further panics.  Child 27 yo will cause some irritability.  Mydayis is better.  Helps her get things done and helps her not overeat.  Likes the added productivity.  She thinks it is better with withdrawal.  Noises bother her chronically.   Can't afford Mydayis. Tends to worry more on Adderall.  But it clearly helps Attn.  Previous Psych Trials include: Carbamazepine, Depakote, Lithium, Risperidone, Abilify weight gain Lithium remotely NR Wellbutrin SE, remote sertraline NR for anger Adderall, Adderall XR, Vyvanse, Nuvigil and Concerta, Intuniv.   Review of Systems:  Review of Systems  Respiratory: Negative for chest tightness.   Cardiovascular: Negative for chest pain and palpitations.  Neurological: Negative for tremors and weakness.  Psychiatric/Behavioral: Negative for agitation, behavioral problems, confusion, decreased concentration, dysphoric mood, hallucinations, self-injury, sleep disturbance and suicidal ideas.  The patient is not nervous/anxious and is not hyperactive.     Medications: I have reviewed the patient's current medications.  Current Outpatient Medications  Medication Sig Dispense Refill  . amphetamine-dextroamphetamine (ADDERALL XR) 25 MG 24 hr capsule Take 1 capsule by mouth in the morning and at bedtime. 60 capsule 0  . clonazePAM (KLONOPIN) 0.5 MG tablet Take 0.25-0.5 mg by mouth 2 (two) times daily as needed.    . divalproex (DEPAKOTE ER) 500 MG 24 hr tablet Take 4 tablets (2,000 mg  total) by mouth daily. (Patient taking differently: Take 1,500 mg by mouth daily.) 360 tablet 1  . fluticasone (FLONASE) 50 MCG/ACT nasal spray SPRAY 1 SPRAY INTO EACH NOSTRIL EVERY DAY    . levothyroxine (SYNTHROID, LEVOTHROID) 50 MCG tablet Take 50 mcg by mouth daily before breakfast.    . Multiple Vitamins-Minerals (MULTIVITAMIN ADULT PO) Take by mouth daily.    . norethindrone-ethinyl estradiol 1/35 (ORTHO-NOVUM, NORTREL,CYCLAFEM) tablet Take 1 tablet by mouth daily.    Marland Kitchen omeprazole (PRILOSEC) 40 MG capsule TAKE 1 CAPSULE BY MOUTH TWICE A DAY 180 capsule 0  . sertraline (ZOLOFT) 50 MG tablet Take 1 tablet (50 mg total) by mouth daily. 90 tablet 0   Current Facility-Administered Medications  Medication Dose Route Frequency Provider Last Rate Last Admin  . 0.9 %  sodium chloride infusion  500 mL Intravenous Continuous Ladene Artist, MD        Medication Side Effects: None  Allergies: No Known Allergies  Past Medical History:  Diagnosis Date  . Bronchitis   . GERD (gastroesophageal reflux disease)   . Herpes   . Thyroid disease     Family History  Problem Relation Age of Onset  . Leukemia Mother     Social History   Socioeconomic History  . Marital status: Single    Spouse name: Not on file  . Number of children: Not on file  . Years of education: Not on file  . Highest education level: Not on file  Occupational History  . Not on file  Tobacco Use  . Smoking status: Never Smoker  . Smokeless tobacco: Never Used  Substance and Sexual Activity  . Alcohol use: No  . Drug use: No  . Sexual activity: Not on file  Other Topics Concern  . Not on file  Social History Narrative  . Not on file   Social Determinants of Health   Financial Resource Strain: Not on file  Food Insecurity: Not on file  Transportation Needs: Not on file  Physical Activity: Not on file  Stress: Not on file  Social Connections: Not on file  Intimate Partner Violence: Not on file    Past  Medical History, Surgical history, Social history, and Family history were reviewed and updated as appropriate.   Please see review of systems for further details on the patient's review from today.   Objective:   Physical Exam:  There were no vitals taken for this visit.  Physical Exam Constitutional:      General: She is not in acute distress.    Appearance: She is well-developed.  Musculoskeletal:        General: No deformity.  Neurological:     Mental Status: She is alert and oriented to person, place, and time.     Motor: No tremor.     Coordination: Coordination normal.     Gait: Gait normal.  Psychiatric:        Attention and Perception: Attention and perception normal.  Mood and Affect: Mood is anxious. Mood is not depressed. Affect is not labile, blunt, angry or inappropriate.        Speech: Speech normal.        Behavior: Behavior is hyperactive.        Thought Content: Thought content normal. Thought content does not include homicidal or suicidal ideation. Thought content does not include homicidal or suicidal plan.        Cognition and Memory: Cognition normal.        Judgment: Judgment normal.     Comments: Insight intact. No auditory or visual hallucinations. No delusions.  No anger Impulsive and interrupts Not overtly manic     Lab Review:  No results found for: NA, K, CL, CO2, GLUCOSE, BUN, CREATININE, CALCIUM, PROT, ALBUMIN, AST, ALT, ALKPHOS, BILITOT, GFRNONAA, GFRAA  No results found for: WBC, RBC, HGB, HCT, PLT, MCV, MCH, MCHC, RDW, LYMPHSABS, MONOABS, EOSABS, BASOSABS  No results found for: POCLITH, LITHIUM   No results found for: PHENYTOIN, PHENOBARB, VALPROATE, CBMZ   .res Assessment: Plan:   Chelsea Jackson was seen today for follow-up, manic behavior and adhd.  Diagnoses and all orders for this visit:  Intermittent explosive disorder in adult -     sertraline (ZOLOFT) 50 MG tablet; Take 1 tablet (50 mg total) by mouth daily.  Attention  deficit hyperactivity disorder (ADHD), combined type -     amphetamine-dextroamphetamine (ADDERALL XR) 25 MG 24 hr capsule; Take 1 capsule by mouth in the morning and at bedtime.  Generalized anxiety disorder -     sertraline (ZOLOFT) 50 MG tablet; Take 1 tablet (50 mg total) by mouth daily.   Again discussed the difference between bipolar disorder and ADD and anger.  She does not have sustained hypomanic nor manic episodes. No psychotic sx. Reports anxiety is better with sertraline and no worse anger with reduction to 1500 mg Depakote.  Wants to try to stop Depakote.  Disc managing panic and managing with and without meds.  We discussed the short-term risks associated with benzodiazepines including sedation and increased fall risk among others.  Discussed long-term side effect risk including dependence, potential withdrawal symptoms, and the potential eventual dose-related risk of dementia.  But recent studies from 2020 dispute this association between benzodiazepines and dementia risk. Newer studies in 2020 do not support an association with dementia. Don't take it routinely with stimulants.  Switch to Mydayis 50 to stop irritable crash seems helpful but she can't afford itl Will return to Aderall XR and try 25 mg BID to prevent crash.  Cont 50 mg Zoloft for anxiety.    Taper Depakote over 6 weeks.  Call if worsens.  There is a risk she's bipolar latently but I don't believe so.  Call if mood swings.  Disc SE.  FU 3 mos.  Lynder Parents, MD, DFAPA   Please see After Visit Summary for patient specific instructions.  Future Appointments  Date Time Provider [place]  05/02/2020  4:00 PM Cottle, Billey Co., MD [facility] None    No orders of the defined types were placed in this encounter.     -------------------------------

## 2020-02-01 NOTE — Patient Instructions (Signed)
Reduce Depakote to 2 tablets daily until the end of February, then reduce to 1 tablet daily for 2 weeks and then stop Depakote mid March

## 2020-02-02 ENCOUNTER — Other Ambulatory Visit: Payer: Self-pay | Admitting: Psychiatry

## 2020-02-02 ENCOUNTER — Telehealth: Payer: Self-pay | Admitting: Psychiatry

## 2020-02-02 DIAGNOSIS — F6381 Intermittent explosive disorder: Secondary | ICD-10-CM

## 2020-02-02 DIAGNOSIS — F411 Generalized anxiety disorder: Secondary | ICD-10-CM

## 2020-02-02 DIAGNOSIS — F902 Attention-deficit hyperactivity disorder, combined type: Secondary | ICD-10-CM

## 2020-02-02 MED ORDER — AMPHETAMINE-DEXTROAMPHET ER 25 MG PO CP24: ORAL_CAPSULE | ORAL | 0 refills | Status: DC

## 2020-02-02 MED ORDER — SERTRALINE HCL 50 MG PO TABS: 50.0000 mg | ORAL_TABLET | Freq: Every day | ORAL | 0 refills | Status: DC

## 2020-02-02 NOTE — Telephone Encounter (Signed)
Pt called and asked if we could send in her Zoloft and Adderall to a different pharmacy. Pt unsure why pharmacy told her that they could not fill it. Pt would like it sent to [facility] on [address] in [place].

## 2020-03-09 ENCOUNTER — Other Ambulatory Visit: Payer: Self-pay | Admitting: Psychiatry

## 2020-03-09 ENCOUNTER — Telehealth: Payer: Self-pay | Admitting: Psychiatry

## 2020-03-09 DIAGNOSIS — F902 Attention-deficit hyperactivity disorder, combined type: Secondary | ICD-10-CM

## 2020-03-09 MED ORDER — AMPHETAMINE-DEXTROAMPHET ER 25 MG PO CP24: ORAL_CAPSULE | ORAL | 0 refills | Status: DC

## 2020-03-09 NOTE — Telephone Encounter (Signed)
Shanira called to request refill of her Adderall XR 25mg .  Appt 05/02/20.  Send to [facility] at [place]

## 2020-04-11 ENCOUNTER — Telehealth: Payer: Self-pay | Admitting: Psychiatry

## 2020-04-11 NOTE — Telephone Encounter (Addendum)
Chelsea Jackson called in today stating that she is not doing well with the adderall 25mg  and feels bad. She asked if it would be possible to switch to Adderal XR 30mg  instead. She has appt 4/27. Pls CB if needed. [facility] [place], [place]

## 2020-04-11 NOTE — Telephone Encounter (Signed)
I spoke to the pt to try and get more info.She said she does not know if it is making her feel bad or not but wants to increase to 30 mg BID

## 2020-04-11 NOTE — Telephone Encounter (Deleted)
She has appt 4/27. Pls CB if needed. [facility] [place]

## 2020-04-12 ENCOUNTER — Other Ambulatory Visit: Payer: Self-pay | Admitting: Psychiatry

## 2020-04-12 DIAGNOSIS — F902 Attention-deficit hyperactivity disorder, combined type: Secondary | ICD-10-CM

## 2020-04-12 MED ORDER — AMPHETAMINE-DEXTROAMPHET ER 30 MG PO CP24: ORAL_CAPSULE | ORAL | 0 refills | Status: DC

## 2020-04-12 NOTE — Telephone Encounter (Signed)
Pt said she does not feel agitated or anxious and she does not think the Adderall is making her feel bad but she had labs drawn today with her PCP.She just thinks it will help her to have a increase and she wants to make sure its ok.I did let her know you needed a specific reason and she really could not give me one other then that.

## 2020-04-12 NOTE — Telephone Encounter (Signed)
Patient is asking to increase Adderall XR 25 mg twice daily to 30 mg twice daily because she feels "bad".  Please get her to describe what these "bad" feelings are.  If she is feeling anxious or agitated then increasing the dose would be the wrong thing to do.

## 2020-04-12 NOTE — Telephone Encounter (Signed)
Pt has called again. °

## 2020-04-12 NOTE — Telephone Encounter (Signed)
Pt left message on voice mail asking CC to send Rx for Adderall XR 30 mg 2/d to [facility]. Apt 4/27

## 2020-04-16 NOTE — Telephone Encounter (Signed)
Patient called in again today stating that she at one point was taking Mydais 50mg . She would like to know if its possible to be prescribed that medication once again because it what works best for her. Pls call to discuss. [phone]. [facility] [address] [place].

## 2020-04-16 NOTE — Telephone Encounter (Signed)
Please review previous messages.

## 2020-04-17 ENCOUNTER — Other Ambulatory Visit: Payer: Self-pay | Admitting: Psychiatry

## 2020-04-17 NOTE — Telephone Encounter (Signed)
Pt says she has already been taking the increased dose and will bring in the adderall.She is asking to start [place] even though you wanted her to take the increased adderall for 2 weeks.

## 2020-04-17 NOTE — Telephone Encounter (Signed)
Last week she requested Adderall XR be increased to 30 mg twice daily.  She picked that up 04/13/2020, 4 days ago.  We will not attempt transition to [place] until she is taken Adderall XR for at least 2 weeks.  She will have to bring in the remainder of the bottle of Adderall XR if she does not use the bottle completely before I will prescribe another stimulant.  Please call her and give her this information

## 2020-04-17 NOTE — Telephone Encounter (Signed)
Do I need to contact pt? Not sure what we are doing for her?

## 2020-04-19 NOTE — Telephone Encounter (Signed)
Pt left another message asking for 50 mg  mydayis to be sent to the [facility] in [place]

## 2020-04-19 NOTE — Telephone Encounter (Signed)
Evaluate this request after Easter

## 2020-04-24 NOTE — Telephone Encounter (Signed)
Please review previous messages.

## 2020-04-24 NOTE — Telephone Encounter (Signed)
Raylinn called to ask again about starting the [facility].  Wanting to know your thoughts on this.  She stopped the Maydais due to cost but it works much better.  She has appt next week 4/27.  But would like to get started.  If you want to wait to appt. Please let her know so she'll know what to expect.

## 2020-05-01 ENCOUNTER — Other Ambulatory Visit: Payer: Self-pay | Admitting: Psychiatry

## 2020-05-01 DIAGNOSIS — F902 Attention-deficit hyperactivity disorder, combined type: Secondary | ICD-10-CM

## 2020-05-01 MED ORDER — MYDAYIS 50 MG PO CP24: 1.0000 | ORAL_CAPSULE | Freq: Every morning | ORAL | 0 refills | Status: DC

## 2020-05-01 NOTE — Telephone Encounter (Signed)
Mydayis 50 was taken in the past.  DC Adderall XR.  Mydayis 50 sent

## 2020-05-02 ENCOUNTER — Encounter: Payer: Self-pay | Admitting: Psychiatry

## 2020-05-02 ENCOUNTER — Ambulatory Visit (INDEPENDENT_AMBULATORY_CARE_PROVIDER_SITE_OTHER): Payer: 59 | Admitting: Psychiatry

## 2020-05-02 ENCOUNTER — Other Ambulatory Visit: Payer: Self-pay

## 2020-05-02 VITALS — BP 136/94 | HR 77

## 2020-05-02 DIAGNOSIS — F6381 Intermittent explosive disorder: Secondary | ICD-10-CM | POA: Diagnosis not present

## 2020-05-02 DIAGNOSIS — F902 Attention-deficit hyperactivity disorder, combined type: Secondary | ICD-10-CM | POA: Diagnosis not present

## 2020-05-02 DIAGNOSIS — F411 Generalized anxiety disorder: Secondary | ICD-10-CM | POA: Diagnosis not present

## 2020-05-02 MED ORDER — MODAFINIL 200 MG PO TABS: ORAL_TABLET | ORAL | 1 refills | Status: DC

## 2020-05-02 MED ORDER — SERTRALINE HCL 100 MG PO TABS: 100.0000 mg | ORAL_TABLET | Freq: Every day | ORAL | 0 refills | Status: DC

## 2020-05-02 NOTE — Progress Notes (Signed)
Chelsea Jackson [id number] 12-Sep-1985 35 y.o.  Subjective:   Patient ID:  Chelsea Jackson is a 35 y.o. (DOB 1985-07-13) female.  Chief Complaint:  Chief Complaint  Patient presents with  . Follow-up  . Intermittent explosive disorder in adult  . Generalized anxiety disorder  . Attention deficit hyperactivity disorder (ADHD), combined t    HPI Chelsea Jackson presents to the office today for follow-up of ADD and anger problems.  seen in January 2020.  No meds were changed.  She was benefiting from the medication.  22-Aug-2019 appt noted: Sister Died brain aneurysm 8 mos ago. Sister took Adderall and was stressed.  Did not take Depakote.   F severely depressed and had to be hospitalized.  Coparenting 84 yo nephew.  Her family disowned her and that's been hard.  Had to get custody of nephew with  Court system.  Still a big adjustment.   Created a lot of stress and H doesn't handle stress well.  She feels she's grown and handled things much better without physical aggression as in the past.  [name] has helped a lot.  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 Moodiness and irritabilty when Adderall wears off.  I'm mean.  Short tempered.  No fighting.   Benefit from the Adderall. Dropped the Depakote to 2000 HS for 6 mos.  She felt maybe the meds would increase her health risk.  No worsening anger problems.  No major mood swings.  No physical acting out anger in a long time.  Has come a long way.  No anger problems with nephew.  PCP RX clonazepam for rare anxiety use.  She understands not to use it regularly with stimulant.   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 Plans to start [place] for weight loss. Plan: Switch to Mydayis 50 to stop irritable crash If she sleeps late then she can use Adderall XR instead  12/06/2019 appointment noted: Busy year.  Taking care of nephew as legal guardian.  That and being married stressful to her.   Panic attack at [place] recently.  Anxiety over health anyway.  Stress herself  out anyway. Chest and arm hurt for hours.  Better the next day. Didn't have clonazepam with her.  Takes it rarely.  It helps when it occurs. Mydayis is better with duration and less irritable crash.  Want's to take the Adderall on days she doesn't take Mydayis. No anger outbursts but some irritability.  Wonders if she needs the Depakote. Plan: Switch to Mydayis 50 to stop irritable crash If she sleeps late then she can use Adderall XR instead Rec retrial Zoloft for anxiety   02/01/20 appt with noted: Sertraline helping anxiety and everything working fine.  No anger outbursts.  Good lately. No further panics.  Child 65 yo will cause some irritability.  Mydayis is better.  Helps her get things done and helps her not overeat.  Likes the added productivity.  She thinks it is better with withdrawal.  Noises bother her chronically.   Can't afford Mydayis. Tends to worry more on Adderall.  But it clearly helps Attn.  Plan: Switch to Mydayis 50 to stop irritable crash seems helpful but she can't afford itl Will return to Aderall XR and try 25 mg BID to prevent crash.  05/02/2020 appointment with following noted: Off Depakote since beginning of the year or so. Feeling tired a lot lately.  Legs achy.  PE normal including TSH.   [facility] [employer] counseling. Moody and irritable  when Adderall wears off.  Taking Adderalll XR in part for apptetite suppressant.  Concerned about Adderall in part for BP.  Others critical of taking Adderall. Weight concerns.  Cost concerns about Mydayis.   Previous Psych Trials include: Carbamazepine, Depakote, Lithium, Risperidone, Abilify weight gain Lithium remotely NR Wellbutrin SE, remote sertraline NR for anger Adderall, Adderall XR, Vyvanse, Nuvigil and Concerta, Intuniv.   Review of Systems:  Review of Systems  Respiratory: Negative for chest tightness.   Cardiovascular: Negative for chest pain and palpitations.  Neurological: Negative for tremors and weakness.   Psychiatric/Behavioral: Negative for agitation, behavioral problems, confusion, decreased concentration, dysphoric mood, hallucinations, self-injury, sleep disturbance and suicidal ideas. The patient is not nervous/anxious and is not hyperactive.     Medications: I have reviewed the patient's current medications.  Current Outpatient Medications  Medication Sig Dispense Refill  . clonazePAM (KLONOPIN) 0.5 MG tablet Take 0.25-0.5 mg by mouth 2 (two) times daily as needed.    Marland Kitchen levothyroxine (SYNTHROID, LEVOTHROID) 50 MCG tablet Take 50 mcg by mouth daily before breakfast.    . modafinil (PROVIGIL) 200 MG tablet 1/2 tablet each morning for 1 week then 1 each morning 30 tablet 1  . Multiple Vitamins-Minerals (MULTIVITAMIN ADULT PO) Take by mouth daily.    . norethindrone-ethinyl estradiol 1/35 (ORTHO-NOVUM, NORTREL,CYCLAFEM) tablet Take 1 tablet by mouth daily.    Marland Kitchen omeprazole (PRILOSEC) 40 MG capsule TAKE 1 CAPSULE BY MOUTH TWICE A DAY 180 capsule 0  . sertraline (ZOLOFT) 100 MG tablet Take 1 tablet (100 mg total) by mouth daily. 90 tablet 0  . fluticasone (FLONASE) 50 MCG/ACT nasal spray SPRAY 1 SPRAY INTO EACH NOSTRIL EVERY DAY (Patient not taking: Reported on 05/02/2020)     Current Facility-Administered Medications  Medication Dose Route Frequency Provider Last Rate Last Admin  . 0.9 %  sodium chloride infusion  500 mL Intravenous Continuous Ladene Artist, MD        Medication Side Effects: None  Allergies: No Known Allergies  Past Medical History:  Diagnosis Date  . Bronchitis   . GERD (gastroesophageal reflux disease)   . Herpes   . Thyroid disease     Family History  Problem Relation Age of Onset  . Leukemia Mother     Social History   Socioeconomic History  . Marital status: Single    Spouse name: Not on file  . Number of children: Not on file  . Years of education: Not on file  . Highest education level: Not on file  Occupational History  . Not on file   Tobacco Use  . Smoking status: Never Smoker  . Smokeless tobacco: Never Used  Substance and Sexual Activity  . Alcohol use: No  . Drug use: No  . Sexual activity: Not on file  Other Topics Concern  . Not on file  Social History Narrative  . Not on file   Social Determinants of Health   Financial Resource Strain: Not on file  Food Insecurity: Not on file  Transportation Needs: Not on file  Physical Activity: Not on file  Stress: Not on file  Social Connections: Not on file  Intimate Partner Violence: Not on file    Past Medical History, Surgical history, Social history, and Family history were reviewed and updated as appropriate.   Please see review of systems for further details on the patient's review from today.   Objective:   Physical Exam:  BP (!) 136/94   Pulse 77   Physical Exam  Constitutional:      General: She is not in acute distress.    Appearance: She is well-developed.  Musculoskeletal:        General: No deformity.  Neurological:     Mental Status: She is alert and oriented to person, place, and time.     Motor: No tremor.     Coordination: Coordination normal.     Gait: Gait normal.  Psychiatric:        Attention and Perception: Attention and perception normal.        Mood and Affect: Mood is anxious. Mood is not depressed. Affect is not labile, blunt, angry or inappropriate.        Speech: Speech normal.        Behavior: Behavior is hyperactive.        Thought Content: Thought content normal. Thought content does not include homicidal or suicidal ideation. Thought content does not include homicidal or suicidal plan.        Cognition and Memory: Cognition normal.        Judgment: Judgment normal.     Comments: Insight intact. No auditory or visual hallucinations. No delusions.  No anger Impulsive and interrupts Not overtly manic     Lab Review:  No results found for: NA, K, CL, CO2, GLUCOSE, BUN, CREATININE, CALCIUM, PROT, ALBUMIN, AST,  ALT, ALKPHOS, BILITOT, GFRNONAA, GFRAA  No results found for: WBC, RBC, HGB, HCT, PLT, MCV, MCH, MCHC, RDW, LYMPHSABS, MONOABS, EOSABS, BASOSABS  No results found for: POCLITH, LITHIUM   No results found for: PHENYTOIN, PHENOBARB, VALPROATE, CBMZ   .res Assessment: Plan:   Nora was seen today for follow-up, intermittent explosive disorder in adult, generalized anxiety disorder and attention deficit hyperactivity disorder (adhd), combined t.  Diagnoses and all orders for this visit:  Attention deficit hyperactivity disorder (ADHD), combined type -     modafinil (PROVIGIL) 200 MG tablet; 1/2 tablet each morning for 1 week then 1 each morning  Generalized anxiety disorder -     sertraline (ZOLOFT) 100 MG tablet; Take 1 tablet (100 mg total) by mouth daily.  Intermittent explosive disorder in adult -     sertraline (ZOLOFT) 100 MG tablet; Take 1 tablet (100 mg total) by mouth daily.   Again discussed the difference between bipolar disorder and ADD and anger.  She does not have sustained hypomanic nor manic episodes. No psychotic sx. Reports anxiety is better with sertraline and no worse anger with reduction to 1500 mg Depakote.  Wants to try to stop Depakote.  Disc managing panic and managing with and without meds.  We discussed the short-term risks associated with benzodiazepines including sedation and increased fall risk among others.  Discussed long-term side effect risk including dependence, potential withdrawal symptoms, and the potential eventual dose-related risk of dementia.  But recent studies from 2020 dispute this association between benzodiazepines and dementia risk. Newer studies in 2020 do not support an association with dementia. Don't take it routinely with stimulants.  Switch to modafinil to deal with concerns about Adderall crash; 200 mg AM  Increase 100 mg Zoloft for anxiety.    Taper Depakote over 6 weeks.  Call if worsens.  There is a risk she's bipolar latently  but I don't believe so.  Call if mood swings.  Disc SE.  FU 3 mos.  Lynder Parents, MD, DFAPA   Please see After Visit Summary for patient specific instructions.  No future appointments.  No orders of the defined types were placed in  this encounter.     -------------------------------

## 2020-05-10 ENCOUNTER — Telehealth: Payer: Self-pay | Admitting: Psychiatry

## 2020-05-10 NOTE — Telephone Encounter (Signed)
Please review

## 2020-05-10 NOTE — Telephone Encounter (Signed)
Pt called and said that she was tapering off the zoloft from 50 mg to 25 mg because of all the side effects and warnings. Is this ok? She also has started the new med. But it has only been a day or two on that. She has seen no change yet. I told her to give it more time. She also wants dr. Clovis Pu to be aware that she is takng 30 mg of adderall. Please give her a call at [phone]

## 2020-05-10 NOTE — Telephone Encounter (Signed)
She was just here last week.  She was taking sertraline 50 mg daily and had previously noticed it helping her anxiety.  She had residual anxiety and had agreed to increase the dose to 100 mg daily.  Now she is calling wanting to taper off because of side effects.  Which side effects?  And why now?  What made her change her mind in the last week?    My suggestion would be to continue the 50 mg until her next appointment with me.  At a previous appointment she indicated it was helpful at lowering the level of anxiety and irritability.  I would especially recommend not changing now n light of the fact that we also are changing stimulants at the same time.

## 2020-05-11 NOTE — Telephone Encounter (Signed)
Pt stated she had thoughts of going off of it due to weight gain,change in mood,and she wants to take less medications.She said she will stay on the dose and discuss at the appt.

## 2020-05-15 ENCOUNTER — Telehealth: Payer: Self-pay | Admitting: Psychiatry

## 2020-05-15 NOTE — Telephone Encounter (Signed)
Next visit is 07/02/20. She wants to get an appt earlier than 07/02/20. Dr. Clovis Pu doesn't have anything any sooner. She wont elaborate on why she needs to be seen sooner. She said it is her medicine and her life. She would not tell me anything more. I have placed her on the wait list.

## 2020-05-15 NOTE — Telephone Encounter (Signed)
Pt has apt tomorrow 5/11

## 2020-05-15 NOTE — Telephone Encounter (Signed)
Please review

## 2020-05-16 ENCOUNTER — Ambulatory Visit: Payer: 59 | Admitting: Psychiatry

## 2020-06-05 ENCOUNTER — Telehealth: Payer: Self-pay | Admitting: Psychiatry

## 2020-06-05 NOTE — Telephone Encounter (Signed)
Next visit is 07/02/20. Requesting refill on Adderall XR 30 mg called to:  [id number] - [place], [place] - [address] AT [address] [place] Phone:  [phone]  Fax:  [phone]

## 2020-06-06 ENCOUNTER — Telehealth: Payer: Self-pay

## 2020-06-06 ENCOUNTER — Other Ambulatory Visit: Payer: Self-pay | Admitting: Psychiatry

## 2020-06-06 DIAGNOSIS — F902 Attention-deficit hyperactivity disorder, combined type: Secondary | ICD-10-CM

## 2020-06-06 MED ORDER — AMPHETAMINE-DEXTROAMPHET ER 30 MG PO CP24: 30.0000 mg | ORAL_CAPSULE | Freq: Every day | ORAL | 0 refills | Status: DC

## 2020-06-06 NOTE — Telephone Encounter (Signed)
The Adderall XR 30 mg prescription has been written various ways in the past as either 1 daily or 1 in the morning and 1 in the afternoon.  Which way does she want it written?  Please call and ask her

## 2020-06-06 NOTE — Telephone Encounter (Signed)
She said just 1 tab in the morning because 2 a day is too much for her.

## 2020-06-06 NOTE — Telephone Encounter (Signed)
sent 

## 2020-06-06 NOTE — Telephone Encounter (Signed)
I gave her a call because I did not see this on med list.She wanted me to let you know she is not taking modafinil but is taking adderall and Zoloft.She would like a Rx sent

## 2020-06-11 ENCOUNTER — Other Ambulatory Visit: Payer: Self-pay

## 2020-06-11 DIAGNOSIS — F902 Attention-deficit hyperactivity disorder, combined type: Secondary | ICD-10-CM

## 2020-06-11 MED ORDER — AMPHETAMINE-DEXTROAMPHET ER 30 MG PO CP24: 30.0000 mg | ORAL_CAPSULE | Freq: Every day | ORAL | 0 refills | Status: DC

## 2020-06-11 NOTE — Telephone Encounter (Signed)
Pt called and said that the pharmacy for the adderall is [facility]  On [address] in [place] not the [facility]. Please cancel and re submit

## 2020-06-11 NOTE — Telephone Encounter (Signed)
Cancelled and pended  

## 2020-06-11 NOTE — Telephone Encounter (Signed)
noted 

## 2020-07-02 ENCOUNTER — Ambulatory Visit: Payer: 59 | Admitting: Psychiatry

## 2020-07-11 ENCOUNTER — Other Ambulatory Visit: Payer: Self-pay | Admitting: Psychiatry

## 2020-07-11 ENCOUNTER — Other Ambulatory Visit: Payer: Self-pay

## 2020-07-11 ENCOUNTER — Telehealth: Payer: Self-pay | Admitting: Psychiatry

## 2020-07-11 DIAGNOSIS — F902 Attention-deficit hyperactivity disorder, combined type: Secondary | ICD-10-CM

## 2020-07-11 MED ORDER — AMPHETAMINE-DEXTROAMPHET ER 30 MG PO CP24: 30.0000 mg | ORAL_CAPSULE | Freq: Every day | ORAL | 0 refills | Status: DC

## 2020-07-11 NOTE — Telephone Encounter (Signed)
Pt called  and needs a refill on her adderall xr 30 mg. It needs to be sent to the [facility] in [place]

## 2020-07-11 NOTE — Telephone Encounter (Signed)
Pended.

## 2020-07-12 ENCOUNTER — Telehealth: Payer: Self-pay | Admitting: Psychiatry

## 2020-07-12 ENCOUNTER — Other Ambulatory Visit: Payer: Self-pay

## 2020-07-12 DIAGNOSIS — F6381 Intermittent explosive disorder: Secondary | ICD-10-CM

## 2020-07-12 DIAGNOSIS — F411 Generalized anxiety disorder: Secondary | ICD-10-CM

## 2020-07-12 MED ORDER — SERTRALINE HCL 100 MG PO TABS: 100.0000 mg | ORAL_TABLET | Freq: Every day | ORAL | 0 refills | Status: DC

## 2020-07-12 NOTE — Telephone Encounter (Signed)
Pt called and said that she needs a refill 90 day supply of her zoloft . Pharmacy is [facility] [address] in [place]

## 2020-07-12 NOTE — Telephone Encounter (Signed)
Rx sent 

## 2020-08-14 ENCOUNTER — Other Ambulatory Visit: Payer: Self-pay

## 2020-08-14 ENCOUNTER — Telehealth: Payer: Self-pay | Admitting: Psychiatry

## 2020-08-14 DIAGNOSIS — F902 Attention-deficit hyperactivity disorder, combined type: Secondary | ICD-10-CM

## 2020-08-14 MED ORDER — AMPHETAMINE-DEXTROAMPHET ER 30 MG PO CP24: 30.0000 mg | ORAL_CAPSULE | Freq: Every day | ORAL | 0 refills | Status: DC

## 2020-08-14 NOTE — Telephone Encounter (Signed)
Pt left a message that she needs a refill on her Adderall xr 30 mg to be sent tot eh [facility] in [place]

## 2020-08-14 NOTE — Telephone Encounter (Signed)
Pended.

## 2020-09-04 ENCOUNTER — Telehealth: Payer: Self-pay | Admitting: Psychiatry

## 2020-09-04 NOTE — Telephone Encounter (Signed)
Next visit is 09/24/20. Chelsea Jackson called to inform that she has tapered off of Zoloft and is now taking 50 mg only. She wants to get completely off of it. She has no side effects from the tapering of the Zoloft. Just wanted to inform.

## 2020-09-04 NOTE — Telephone Encounter (Signed)
noteded

## 2020-09-04 NOTE — Telephone Encounter (Signed)
FYI

## 2020-09-06 ENCOUNTER — Telehealth: Payer: Self-pay | Admitting: Psychiatry

## 2020-09-06 NOTE — Telephone Encounter (Signed)
Patient is weaning off sertraline and becoming more depressed.  It is reasonable to think those 2 things are associated.  I see her in about 2 weeks.  I would suggest she increase the sertraline back up to between 50 and 100 mg until I see her and then if she wants to switch to an alternative we can do so at the appointment.  I do not want to switch medicines between appointments because it is too complicated.

## 2020-09-06 NOTE — Telephone Encounter (Signed)
Pt left a message stating that she was not doing well and very depressed. Sge would like to talk to dr. Clovis Pu. Please call her at [phone]

## 2020-09-06 NOTE — Telephone Encounter (Signed)
Pt just called on 8/30, tapering off of Zoloft and wanting to completely come off.

## 2020-09-07 NOTE — Telephone Encounter (Signed)
LVM to return call.

## 2020-09-11 ENCOUNTER — Other Ambulatory Visit: Payer: Self-pay

## 2020-09-11 ENCOUNTER — Telehealth: Payer: Self-pay | Admitting: Psychiatry

## 2020-09-11 DIAGNOSIS — F902 Attention-deficit hyperactivity disorder, combined type: Secondary | ICD-10-CM

## 2020-09-11 MED ORDER — AMPHETAMINE-DEXTROAMPHET ER 30 MG PO CP24: 30.0000 mg | ORAL_CAPSULE | Freq: Every day | ORAL | 0 refills | Status: DC

## 2020-09-11 NOTE — Telephone Encounter (Signed)
Pended.

## 2020-09-11 NOTE — Telephone Encounter (Signed)
Patient lm requesting a refill on the Adderall XR.Fill at the [facility] location. A follow up appointment is scheduled for 9/19.

## 2020-09-19 NOTE — Telephone Encounter (Signed)
Please try to call her again and see how she's doing.  If she still feels bad then follow the instructions about sertraline.

## 2020-09-24 ENCOUNTER — Ambulatory Visit: Payer: 59 | Admitting: Psychiatry

## 2020-09-24 NOTE — Telephone Encounter (Signed)
Pt has an appt today.  

## 2020-10-08 ENCOUNTER — Other Ambulatory Visit: Payer: Self-pay | Admitting: Psychiatry

## 2020-10-08 DIAGNOSIS — F6381 Intermittent explosive disorder: Secondary | ICD-10-CM

## 2020-10-08 DIAGNOSIS — F411 Generalized anxiety disorder: Secondary | ICD-10-CM

## 2020-10-10 ENCOUNTER — Telehealth: Payer: Self-pay | Admitting: Psychiatry

## 2020-10-10 ENCOUNTER — Other Ambulatory Visit: Payer: Self-pay

## 2020-10-10 DIAGNOSIS — F902 Attention-deficit hyperactivity disorder, combined type: Secondary | ICD-10-CM

## 2020-10-10 MED ORDER — AMPHETAMINE-DEXTROAMPHETAMINE 30 MG PO TABS: 30.0000 mg | ORAL_TABLET | Freq: Every day | ORAL | 0 refills | Status: DC

## 2020-10-10 MED ORDER — AMPHETAMINE-DEXTROAMPHET ER 30 MG PO CP24: 30.0000 mg | ORAL_CAPSULE | Freq: Every day | ORAL | 0 refills | Status: DC

## 2020-10-10 NOTE — Telephone Encounter (Signed)
Pt called requesting Adderall XR 30 mg Rx to [place]. APT 11/22

## 2020-10-10 NOTE — Telephone Encounter (Signed)
Pended.

## 2020-10-19 ENCOUNTER — Telehealth: Payer: Self-pay | Admitting: Psychiatry

## 2020-10-19 NOTE — Telephone Encounter (Signed)
Deiondra called because she has a friend that is in need of a therapist/counselor at least.  Depression, anxiety, drugs. She has resources to look into but because she trust your option, other than just picking a name she wants to know if you have a recommendation for a therapist in the [place] area or [place].  Keep in mind if your recommend on of ours, we are booked into Dec.

## 2020-10-19 NOTE — Telephone Encounter (Signed)
I would recommend either Monique Crutchfield or [facility].

## 2020-10-30 ENCOUNTER — Other Ambulatory Visit: Payer: Self-pay | Admitting: Psychiatry

## 2020-10-30 DIAGNOSIS — F6381 Intermittent explosive disorder: Secondary | ICD-10-CM

## 2020-10-30 DIAGNOSIS — F411 Generalized anxiety disorder: Secondary | ICD-10-CM

## 2020-11-13 ENCOUNTER — Other Ambulatory Visit: Payer: Self-pay

## 2020-11-13 ENCOUNTER — Ambulatory Visit (INDEPENDENT_AMBULATORY_CARE_PROVIDER_SITE_OTHER): Payer: 59 | Admitting: Psychiatry

## 2020-11-13 ENCOUNTER — Encounter: Payer: Self-pay | Admitting: Psychiatry

## 2020-11-13 DIAGNOSIS — F6381 Intermittent explosive disorder: Secondary | ICD-10-CM

## 2020-11-13 DIAGNOSIS — F39 Unspecified mood [affective] disorder: Secondary | ICD-10-CM | POA: Diagnosis not present

## 2020-11-13 DIAGNOSIS — F902 Attention-deficit hyperactivity disorder, combined type: Secondary | ICD-10-CM | POA: Diagnosis not present

## 2020-11-13 DIAGNOSIS — F411 Generalized anxiety disorder: Secondary | ICD-10-CM | POA: Diagnosis not present

## 2020-11-13 MED ORDER — SERTRALINE HCL 100 MG PO TABS: 150.0000 mg | ORAL_TABLET | Freq: Every day | ORAL | 0 refills | Status: DC

## 2020-11-13 MED ORDER — AMPHETAMINE-DEXTROAMPHET ER 25 MG PO CP24: ORAL_CAPSULE | ORAL | 0 refills | Status: DC

## 2020-11-13 NOTE — Progress Notes (Signed)
Chelsea Jackson [id number] [birth year] 35 y.o.  Subjective:   Patient ID:  Chelsea Jackson is a 35 y.o. (DOB 1985-06-08) female.  Chief Complaint:  Chief Complaint  Patient presents with   Follow-up   Anxiety   Depression   Stress   Fatigue    HPI Chelsea Jackson presents to the office today for follow-up of ADD and anger problems.  seen in January 2020.  No meds were changed.  She was benefiting from the medication.  2019-09-02 appt noted: Sister Died brain aneurysm 8 mos ago. Sister took Adderall and was stressed.  Did not take Depakote.   F severely depressed and had to be hospitalized.  Coparenting 52 yo nephew.  Her family disowned her and that's been hard.  Had to get custody of nephew with  Court system.  Still a big adjustment.   Created a lot of stress and H doesn't handle stress well.  She feels she's grown and handled things much better without physical aggression as in the past.  Chelsea Jackson has helped a lot.  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 Moodiness and irritabilty when Adderall wears off.  I'm mean.  Short tempered.  No fighting.   Benefit from the Adderall. Dropped the Depakote to 2000 HS for 6 mos.  She felt maybe the meds would increase her health risk.  No worsening anger problems.  No major mood swings.  No physical acting out anger in a long time.  Has come a long way.  No anger problems with nephew.  PCP RX clonazepam for rare anxiety use.  She understands not to use it regularly with stimulant.   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 Plans to start Katherine for weight loss. Plan: Switch to Mydayis 50 to stop irritable crash If she sleeps late then she can use Adderall XR instead  12/06/2019 appointment noted: Busy year.  Taking care of nephew as legal guardian.  That and being married stressful to her.   Panic attack at [place] recently.  Anxiety over health anyway.  Stress herself out anyway. Chest and arm hurt for hours.  Better the next day. Didn't have clonazepam with her.   Takes it rarely.  It helps when it occurs. Mydayis is better with duration and less irritable crash.  Want's to take the Adderall on days she doesn't take Mydayis. No anger outbursts but some irritability.  Wonders if she needs the Depakote. Plan: Switch to Mydayis 50 to stop irritable crash If she sleeps late then she can use Adderall XR instead Rec retrial Zoloft for anxiety   02/01/20 appt with noted: Sertraline helping anxiety and everything working fine.  No anger outbursts.  Good lately. No further panics.  Child 24 yo will cause some irritability.  Mydayis is better.  Helps her get things done and helps her not overeat.  Likes the added productivity.  She thinks it is better with withdrawal.  Noises bother her chronically.   Can't afford Mydayis. Tends to worry more on Adderall.  But it clearly helps Attn.  Plan: Switch to Mydayis 50 to stop irritable crash seems helpful but she can't afford itl Will return to Aderall XR and try 25 mg BID to prevent crash.  05/02/2020 appointment with following noted: Off Depakote since beginning of the year or so. Feeling tired a lot lately.  Legs achy.  PE normal including TSH.   New Christian counseling. Moody and irritable when Adderall wears off.  Taking Adderalll XR in  part for apptetite suppressant.  Concerned about Adderall in part for BP.  Others critical of taking Adderall. Weight concerns.  Cost concerns about Mydayis. Plan: Switch to modafinil to deal with concerns about Adderall crash; 200 mg AM Increase 100 mg Zoloft for anxiety.    11/13/20 appt noted: Taking Adderall XR 30 AM and 15mg  IR at 3 PM, sertraline 100.  No modafinil. Took herself off sertraline and emotional wreck.   Dealing with a lot of stress at home being wife and mother.  Son counseling Chelsea Jackson. At times overstimulated and stressed and engaged in [facility] and alcohol and didn't help.  Staying away from it now. Last weekend emotions everywhere and scared thoughts including  fear of death. Transition with churches and small groups. Hasn't been this low in years.   Sleep interrupted 8 hours.  Eating is better. Some anger with adoptied son at night.   Previous Psych Trials include: Carbamazepine, Depakote, Lithium, Risperidone, Abilify weight gain Lithium remotely NR Wellbutrin SE, remote sertraline NR for anger Adderall, Adderall XR, Vyvanse, Nuvigil and Concerta, Intuniv. Xanax sedation  Review of Systems:  Review of Systems  Respiratory:  Negative for chest tightness.   Cardiovascular:  Negative for chest pain and palpitations.  Neurological:  Negative for tremors and weakness.  Psychiatric/Behavioral:  Positive for dysphoric mood. Negative for agitation, behavioral problems, confusion, decreased concentration, hallucinations, self-injury, sleep disturbance and suicidal ideas. The patient is nervous/anxious. The patient is not hyperactive.    Medications: I have reviewed the patient's current medications.  Current Outpatient Medications  Medication Sig Dispense Refill   amphetamine-dextroamphetamine (ADDERALL XR) 25 MG 24 hr capsule 1 in the AM and 1 at 2-3 PM 60 capsule 0   amphetamine-dextroamphetamine (ADDERALL XR) 30 MG 24 hr capsule Take 1 capsule (30 mg total) by mouth daily. 30 capsule 0   clonazePAM (KLONOPIN) 0.5 MG tablet Take 0.25-0.5 mg by mouth 2 (two) times daily as needed.     fluticasone (FLONASE) 50 MCG/ACT nasal spray      levothyroxine (SYNTHROID, LEVOTHROID) 50 MCG tablet Take 50 mcg by mouth daily before breakfast.     Multiple Vitamins-Minerals (MULTIVITAMIN ADULT PO) Take by mouth daily.     norethindrone-ethinyl estradiol 1/35 (ORTHO-NOVUM, NORTREL,CYCLAFEM) tablet Take 1 tablet by mouth daily.     omeprazole (PRILOSEC) 40 MG capsule TAKE 1 CAPSULE BY MOUTH TWICE A DAY 180 capsule 0   sertraline (ZOLOFT) 100 MG tablet Take 1.5 tablets (150 mg total) by mouth daily. 135 tablet 0   Current Facility-Administered Medications   Medication Dose Route Frequency Provider Last Rate Last Admin   0.9 %  sodium chloride infusion  500 mL Intravenous Continuous Ladene Artist, MD        Medication Side Effects: None  Allergies: No Known Allergies  Past Medical History:  Diagnosis Date   Bronchitis    GERD (gastroesophageal reflux disease)    Herpes    Thyroid disease     Family History  Problem Relation Age of Onset   Leukemia Mother     Social History   Socioeconomic History   Marital status: Single    Spouse name: Not on file   Number of children: Not on file   Years of education: Not on file   Highest education level: Not on file  Occupational History   Not on file  Tobacco Use   Smoking status: Never   Smokeless tobacco: Never  Substance and Sexual Activity   Alcohol use: No  Drug use: No   Sexual activity: Not on file  Other Topics Concern   Not on file  Social History Narrative   Not on file   Social Determinants of Health   Financial Resource Strain: Not on file  Food Insecurity: Not on file  Transportation Needs: Not on file  Physical Activity: Not on file  Stress: Not on file  Social Connections: Not on file  Intimate Partner Violence: Not on file    Past Medical History, Surgical history, Social history, and Family history were reviewed and updated as appropriate.   Please see review of systems for further details on the patient's review from today.   Objective:   Physical Exam:  There were no vitals taken for this visit.  Physical Exam Constitutional:      General: She is not in acute distress.    Appearance: She is well-developed.  Musculoskeletal:        General: No deformity.  Neurological:     Mental Status: She is alert and oriented to person, place, and time.     Motor: No tremor.     Coordination: Coordination normal.     Gait: Gait normal.  Psychiatric:        Attention and Perception: Attention and perception normal.        Mood and Affect: Mood is  anxious and depressed. Affect is not labile, blunt, angry or inappropriate.        Speech: Speech normal.        Behavior: Behavior is hyperactive.        Thought Content: Thought content normal. Thought content does not include homicidal or suicidal ideation. Thought content does not include homicidal or suicidal plan.        Cognition and Memory: Cognition normal.        Judgment: Judgment normal.     Comments: Insight intact. No auditory or visual hallucinations. No delusions.  No anger Impulsive and interrupts Not overtly manic, chronic insurance seeking Chronic ambivalence with meds.    Lab Review:  No results found for: NA, K, CL, CO2, GLUCOSE, BUN, CREATININE, CALCIUM, PROT, ALBUMIN, AST, ALT, ALKPHOS, BILITOT, GFRNONAA, GFRAA  No results found for: WBC, RBC, HGB, HCT, PLT, MCV, MCH, MCHC, RDW, LYMPHSABS, MONOABS, EOSABS, BASOSABS  No results found for: POCLITH, LITHIUM   No results found for: PHENYTOIN, PHENOBARB, VALPROATE, CBMZ   .res Assessment: Plan:   Posey was seen today for follow-up, anxiety, depression, stress and fatigue.  Diagnoses and all orders for this visit:  Generalized anxiety disorder -     sertraline (ZOLOFT) 100 MG tablet; Take 1.5 tablets (150 mg total) by mouth daily.  Mood disorder (HCC)  Attention deficit hyperactivity disorder (ADHD), combined type -     amphetamine-dextroamphetamine (ADDERALL XR) 25 MG 24 hr capsule; 1 in the AM and 1 at 2-3 PM  Intermittent explosive disorder in adult -     sertraline (ZOLOFT) 100 MG tablet; Take 1.5 tablets (150 mg total) by mouth daily.  Again discussed the difference between bipolar disorder and ADD and anger.  She does not have sustained hypomanic nor manic episodes. No psychotic sx.  Disc managing panic and managing with and without meds.  We discussed the short-term risks associated with benzodiazepines including sedation and increased fall risk among others.  Discussed long-term side effect risk  including dependence, potential withdrawal symptoms, and the potential eventual dose-related risk of dementia.  But recent studies from 2020 dispute this association between benzodiazepines and dementia  risk. Newer studies in 2020 do not support an association with dementia. Don't take it routinely with stimulants.  Increase 150 mg Zoloft for anxiety.    Continue clonazepam 1 mg prn. Uses about 1-2 times weekly in emergency  Continue Adderall trial increase XR 25 mg BID  There is a risk she's bipolar latently but I don't believe so.  Call if mood swings.  Disc SE.  FU 2 mos.  Lynder Parents, MD, DFAPA   Please see After Visit Summary for patient specific instructions.  Future Appointments  Date Time Provider [place]  11/27/2020  1:30 PM Cottle, Billey Co., MD [facility] None    No orders of the defined types were placed in this encounter.     -------------------------------

## 2020-11-13 NOTE — Patient Instructions (Addendum)
Increase sertraline to 1 and 1/2 tablets daily  Take Adderall XR 25 mg in the morning and Adderall XR 25 mg between 2 and 3 in the afternoon.

## 2020-11-27 ENCOUNTER — Ambulatory Visit: Payer: 59 | Admitting: Psychiatry

## 2020-12-17 ENCOUNTER — Other Ambulatory Visit: Payer: Self-pay | Admitting: Psychiatry

## 2020-12-17 DIAGNOSIS — F6381 Intermittent explosive disorder: Secondary | ICD-10-CM

## 2020-12-17 DIAGNOSIS — F411 Generalized anxiety disorder: Secondary | ICD-10-CM

## 2020-12-17 NOTE — Telephone Encounter (Signed)
90 day ok?

## 2020-12-18 ENCOUNTER — Other Ambulatory Visit: Payer: Self-pay

## 2020-12-18 ENCOUNTER — Telehealth: Payer: Self-pay | Admitting: Psychiatry

## 2020-12-18 ENCOUNTER — Other Ambulatory Visit: Payer: Self-pay | Admitting: Psychiatry

## 2020-12-18 DIAGNOSIS — F902 Attention-deficit hyperactivity disorder, combined type: Secondary | ICD-10-CM

## 2020-12-18 MED ORDER — AMPHETAMINE-DEXTROAMPHET ER 30 MG PO CP24: 30.0000 mg | ORAL_CAPSULE | Freq: Every day | ORAL | 0 refills | Status: DC

## 2020-12-18 NOTE — Telephone Encounter (Signed)
Pended. 2 refills for 30 mg ER dose only.

## 2020-12-18 NOTE — Telephone Encounter (Signed)
Pended.

## 2020-12-18 NOTE — Telephone Encounter (Signed)
Patient called in to request that her Adderall prescription be sent to a different pharmacy. She would like it to be sent to [facility]

## 2020-12-18 NOTE — Telephone Encounter (Signed)
Chelsea Jackson called to request refill of her Adderall XR. BUT she said taking 50 mg/day is too much.  It causes her heart rate to go up and it is not a good feeling.  Please just fill 30mg  once a day.  Appt 1/23.  [facility] [address] in [place], [place]

## 2020-12-21 ENCOUNTER — Other Ambulatory Visit: Payer: Self-pay

## 2020-12-21 ENCOUNTER — Telehealth: Payer: Self-pay | Admitting: Psychiatry

## 2020-12-21 MED ORDER — AMPHETAMINE-DEXTROAMPHETAMINE 20 MG PO TABS
20.0000 mg | ORAL_TABLET | Freq: Two times a day (BID) | ORAL | 0 refills | Status: DC
Start: 2020-12-21 — End: 2021-01-22

## 2020-12-21 NOTE — Telephone Encounter (Signed)
Pended.

## 2020-12-21 NOTE — Telephone Encounter (Signed)
Chelsea Jackson called again about her Adderall XR refill.  You sent in a prescription for 30mg  as she requested but it is a capsule so she can't split it to be able to take 15mg  twice a day.  The 50mg  was too much but perhaps she could tolerate 20mg  twice a day.  Please cancel the 30mg  and send 20mg  twice a day.  Kristopher [facility]en[facility] on [address]

## 2020-12-21 NOTE — Telephone Encounter (Signed)
Please review

## 2020-12-21 NOTE — Telephone Encounter (Signed)
Ok pend it.  No Adderall XR just regular Adderall 20 mg tablets 1 twice daily

## 2021-01-22 ENCOUNTER — Other Ambulatory Visit: Payer: Self-pay | Admitting: Psychiatry

## 2021-01-22 MED ORDER — AMPHETAMINE-DEXTROAMPHETAMINE 20 MG PO TABS: 20.0000 mg | ORAL_TABLET | Freq: Two times a day (BID) | ORAL | 0 refills | Status: DC

## 2021-01-22 NOTE — Telephone Encounter (Signed)
Pt called and said that she wants the adderall xr 30 mg to be cancelled and send adderall 20 mg 1 in the am and 1 at lunch. She wants it sent to the [facility] at on [address] in [place]

## 2021-01-28 ENCOUNTER — Other Ambulatory Visit: Payer: Self-pay

## 2021-01-28 ENCOUNTER — Ambulatory Visit (INDEPENDENT_AMBULATORY_CARE_PROVIDER_SITE_OTHER): Payer: 59 | Admitting: Psychiatry

## 2021-01-28 ENCOUNTER — Encounter: Payer: Self-pay | Admitting: Psychiatry

## 2021-01-28 DIAGNOSIS — F39 Unspecified mood [affective] disorder: Secondary | ICD-10-CM

## 2021-01-28 DIAGNOSIS — F411 Generalized anxiety disorder: Secondary | ICD-10-CM

## 2021-01-28 DIAGNOSIS — F6381 Intermittent explosive disorder: Secondary | ICD-10-CM | POA: Diagnosis not present

## 2021-01-28 DIAGNOSIS — F902 Attention-deficit hyperactivity disorder, combined type: Secondary | ICD-10-CM | POA: Diagnosis not present

## 2021-01-28 MED ORDER — LORAZEPAM 1 MG PO TABS: 1.0000 mg | ORAL_TABLET | Freq: Three times a day (TID) | ORAL | 0 refills | Status: DC

## 2021-01-28 MED ORDER — SERTRALINE HCL 100 MG PO TABS: 200.0000 mg | ORAL_TABLET | Freq: Every day | ORAL | 0 refills | Status: DC

## 2021-01-28 MED ORDER — LISDEXAMFETAMINE DIMESYLATE 50 MG PO CAPS: 50.0000 mg | ORAL_CAPSULE | Freq: Every day | ORAL | 0 refills | Status: DC

## 2021-01-28 NOTE — Patient Instructions (Signed)
Change Adderall to Vyvanse 50 mg 1 each morning Increase sertraline to 2 daily to see if it helps with anxiety and stress Stop clonazepam and use lorazepam or Ativan 1 tablet as needed for anxiety

## 2021-01-28 NOTE — Progress Notes (Signed)
[name] [id number] July 07, 1985 36 y.o.  Subjective:   Patient ID:  Chelsea Jackson is a 36 y.o. (DOB 20-Dec-1985) female.  Chief Complaint:  Chief Complaint  Patient presents with   Follow-up   ADHD   Anxiety   Medication Problem    HPI Chelsea Jackson presents to the office today for follow-up of ADD and anger problems.  seen in January 2020.  No meds were changed.  She was benefiting from the medication.  Aug 29, 2019 appt noted: Sister Died brain aneurysm 8 mos ago. Sister took Adderall and was stressed.  Did not take Depakote.   F severely depressed and had to be hospitalized.  Coparenting 69 yo nephew.  Her family disowned her and that's been hard.  Had to get custody of nephew with  Court system.  Still a big adjustment.   Created a lot of stress and H doesn't handle stress well.  She feels she's grown and handled things much better without physical aggression as in the past.  [name] has helped a lot.  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 Moodiness and irritabilty when Adderall wears off.  I'm mean.  Short tempered.  No fighting.   Benefit from the Adderall. Dropped the Depakote to 2000 HS for 6 mos.  She felt maybe the meds would increase her health risk.  No worsening anger problems.  No major mood swings.  No physical acting out anger in a long time.  Has come a long way.  No anger problems with nephew.  PCP RX clonazepam for rare anxiety use.  She understands not to use it regularly with stimulant.   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 Plans to start Lee for weight loss. Plan: Switch to Mydayis 50 to stop irritable crash If she sleeps late then she can use Adderall XR instead  12/06/2019 appointment noted: Busy year.  Taking care of nephew as legal guardian.  That and being married stressful to her.   Panic attack at [place] recently.  Anxiety over health anyway.  Stress herself out anyway. Chest and arm hurt for hours.  Better the next day. Didn't have clonazepam with her.  Takes  it rarely.  It helps when it occurs. Mydayis is better with duration and less irritable crash.  Want's to take the Adderall on days she doesn't take Mydayis. No anger outbursts but some irritability.  Wonders if she needs the Depakote. Plan: Switch to Mydayis 50 to stop irritable crash If she sleeps late then she can use Adderall XR instead Rec retrial Zoloft for anxiety   02/01/20 appt with noted: Sertraline helping anxiety and everything working fine.  No anger outbursts.  Good lately. No further panics.  Child 32 yo will cause some irritability.  Mydayis is better.  Helps her get things done and helps her not overeat.  Likes the added productivity.  She thinks it is better with withdrawal.  Noises bother her chronically.   Can't afford Mydayis. Tends to worry more on Adderall.  But it clearly helps Attn.  Plan: Switch to Mydayis 50 to stop irritable crash seems helpful but she can't afford itl Will return to Aderall XR and try 25 mg BID to prevent crash.  05/02/2020 appointment with following noted: Off Depakote since beginning of the year or so. Feeling tired a lot lately.  Legs achy.  PE normal including TSH.   New Christian counseling. Moody and irritable when Adderall wears off.  Taking Adderalll XR in part for  apptetite suppressant.  Concerned about Adderall in part for BP.  Others critical of taking Adderall. Weight concerns.  Cost concerns about Mydayis. Plan: Switch to modafinil to deal with concerns about Adderall crash; 200 mg AM Increase 100 mg Zoloft for anxiety.    11/13/20 appt noted: Taking Adderall XR 30 AM and 15mg  IR at 3 PM, sertraline 100.  No modafinil. Took herself off sertraline and emotional wreck.   Dealing with a lot of stress at home being wife and mother.  Son counseling Chelsea Jackson. At times overstimulated and stressed and engaged in [facility] and alcohol and didn't help.  Staying away from it now. Last weekend emotions everywhere and scared thoughts including fear of  death. Transition with churches and small groups. Hasn't been this low in years.   Sleep interrupted 8 hours.  Eating is better. Some anger with adoptied son at night. Plan: Increase 150 mg Zoloft for anxiety.   Continue clonazepam 1 mg prn. Uses about 1-2 times weekly in emergency Continue Adderall trial increase XR 25 mg BID  12/21/2020 phone call from patient wanting Adderall prescription changed.  It was changed to 20 mg twice daily of the tablets without Adderall XR.  01/28/2021 appointment the following noted: Has experimented with Adderall stimulant. Took friend's Xanax and hangover. Clonopin doesn't help anxiety quickly. [employer] and counselor helping. Taking care of [place]. Emotions are getting better. Chronic px with H but hasn't been physical with him in years but did and lost it with his provocation.  H can be too harsh. Can be fine to explosive in an instant.   Better now.  Once in years and not dealing with it. Chronic stress with family.  Gets overwhelming and tiring. No familly help.  Previous Psych Trials include: Carbamazepine, Depakote, Lithium, Risperidone, Abilify weight gain Lithium remotely NR Wellbutrin SE,  remote sertraline NR for anger  Adderall, Adderall XR, Vyvanse, Nuvigil and Concerta, Intuniv. Xanax sedation  Review of Systems:  Review of Systems  Respiratory:  Negative for chest tightness.   Cardiovascular:  Negative for chest pain and palpitations.  Neurological:  Negative for tremors and weakness.  Psychiatric/Behavioral:  Negative for agitation, behavioral problems, confusion, decreased concentration, dysphoric mood, hallucinations, self-injury, sleep disturbance and suicidal ideas. The patient is nervous/anxious. The patient is not hyperactive.    Medications: I have reviewed the patient's current medications.  Current Outpatient Medications  Medication Sig Dispense Refill   amphetamine-dextroamphetamine (ADDERALL XR) 25 MG 24 hr  capsule 1 in the AM and 1 at 2-3 PM 60 capsule 0   amphetamine-dextroamphetamine (ADDERALL XR) 30 MG 24 hr capsule Take 1 capsule (30 mg total) by mouth daily. 30 capsule 0   amphetamine-dextroamphetamine (ADDERALL XR) 30 MG 24 hr capsule Take 1 capsule (30 mg total) by mouth daily. 30 capsule 0   amphetamine-dextroamphetamine (ADDERALL) 20 MG tablet Take 1 tablet (20 mg total) by mouth 2 (two) times daily. (Patient taking differently: 1 IN AFTERNOON) 60 tablet 0   clonazePAM (KLONOPIN) 0.5 MG tablet Take 0.25-0.5 mg by mouth 2 (two) times daily as needed.     fluticasone (FLONASE) 50 MCG/ACT nasal spray      levothyroxine (SYNTHROID, LEVOTHROID) 50 MCG tablet Take 50 mcg by mouth daily before breakfast.     Multiple Vitamins-Minerals (MULTIVITAMIN ADULT PO) Take by mouth daily.     norethindrone-ethinyl estradiol 1/35 (ORTHO-NOVUM, NORTREL,CYCLAFEM) tablet Take 1 tablet by mouth daily.     omeprazole (PRILOSEC) 40 MG capsule TAKE 1 CAPSULE BY MOUTH TWICE  A DAY 180 capsule 0   sertraline (ZOLOFT) 100 MG tablet TAKE 1 TABLET BY MOUTH EVERY DAY (Patient taking differently: Take 150 mg by mouth daily.) 90 tablet 0   Current Facility-Administered Medications  Medication Dose Route Frequency Provider Last Rate Last Admin   0.9 %  sodium chloride infusion  500 mL Intravenous Continuous Ladene Artist, MD        Medication Side Effects: None  Allergies: No Known Allergies  Past Medical History:  Diagnosis Date   Bronchitis    GERD (gastroesophageal reflux disease)    Herpes    Thyroid disease     Family History  Problem Relation Age of Onset   Leukemia Mother     Social History   Socioeconomic History   Marital status: Single    Spouse name: Not on file   Number of children: Not on file   Years of education: Not on file   Highest education level: Not on file  Occupational History   Not on file  Tobacco Use   Smoking status: Never   Smokeless tobacco: Never  Substance and  Sexual Activity   Alcohol use: No   Drug use: No   Sexual activity: Not on file  Other Topics Concern   Not on file  Social History Narrative   Not on file   Social Determinants of Health   Financial Resource Strain: Not on file  Food Insecurity: Not on file  Transportation Needs: Not on file  Physical Activity: Not on file  Stress: Not on file  Social Connections: Not on file  Intimate Partner Violence: Not on file    Past Medical History, Surgical history, Social history, and Family history were reviewed and updated as appropriate.   Please see review of systems for further details on the patient's review from today.   Objective:   Physical Exam:  There were no vitals taken for this visit.  Physical Exam Constitutional:      General: She is not in acute distress.    Appearance: She is well-developed.  Musculoskeletal:        General: No deformity.  Neurological:     Mental Status: She is alert and oriented to person, place, and time.     Motor: No tremor.     Coordination: Coordination normal.     Gait: Gait normal.  Psychiatric:        Attention and Perception: Attention and perception normal.        Mood and Affect: Mood is anxious. Mood is not depressed. Affect is not labile, blunt, angry or inappropriate.        Speech: Speech normal.        Behavior: Behavior is hyperactive.        Thought Content: Thought content normal. Thought content is not delusional. Thought content does not include homicidal or suicidal ideation. Thought content does not include suicidal plan.        Cognition and Memory: Cognition normal.        Judgment: Judgment normal.     Comments: Insight intact. No auditory or visual hallucinations. No delusions.  No anger Impulsive and interrupts chronically Not overtly manic, chronic reassurance seeking Chronic ambivalence with meds.    Lab Review:  No results found for: NA, K, CL, CO2, GLUCOSE, BUN, CREATININE, CALCIUM, PROT, ALBUMIN,  AST, ALT, ALKPHOS, BILITOT, GFRNONAA, GFRAA  No results found for: WBC, RBC, HGB, HCT, PLT, MCV, MCH, MCHC, RDW, LYMPHSABS, MONOABS, EOSABS, BASOSABS  No  results found for: POCLITH, LITHIUM   No results found for: PHENYTOIN, PHENOBARB, VALPROATE, CBMZ   .res Assessment: Plan:   Janeene was seen today for follow-up, adhd, anxiety and medication problem.  Diagnoses and all orders for this visit:  Generalized anxiety disorder  Mood disorder ([place])  Attention deficit hyperactivity disorder (ADHD), combined type  Intermittent explosive disorder in adult   Again discussed the difference between bipolar disorder and ADD and anger.  She does not have sustained hypomanic nor manic episodes. No psychotic sx.  Disc managing panic and managing with and without meds.  We discussed the short-term risks associated with benzodiazepines including sedation and increased fall risk among others.  Discussed long-term side effect risk including dependence, potential withdrawal symptoms, and the potential eventual dose-related risk of dementia.  But recent studies from 2020 dispute this association between benzodiazepines and dementia risk. Newer studies in 2020 do not support an association with dementia. Don't take it routinely with stimulants.  Increase 200 mg Zoloft for anxiety.    Change clonazepam to Ativan 1 mg prn. Uses about 1-2 times weekly in emergency  Consider retrial Vyvanse for longer smoother duration with less crash. Vyvanse 50 mg AM If vyvanse covered then return to Adderall XR 20 mg BID  There is a risk she's bipolar latently but I don't believe so.  Call if mood swings.  Disc SE.  FU 2 mos.  Lynder Parents, MD, DFAPA   Please see After Visit Summary for patient specific instructions.  No future appointments.   No orders of the defined types were placed in this encounter.      -------------------------------

## 2021-01-29 ENCOUNTER — Telehealth: Payer: Self-pay

## 2021-01-29 NOTE — Telephone Encounter (Signed)
Patient notified of PA approval.

## 2021-01-29 NOTE — Telephone Encounter (Signed)
Prior Authorization submitted and approved for VYVANSE 50 MG effective 01/29/2021-01/29/2024 with [employer] ID# [id number]   Please let pt know

## 2021-02-06 ENCOUNTER — Telehealth: Payer: Self-pay | Admitting: Psychiatry

## 2021-02-06 NOTE — Telephone Encounter (Signed)
Pt called reporting Vyvanse doesn't work. She would like to know what you think about trying Concerta? First choice is Mydayis, with insurance $100. Send Rx to [place]. Contact @ [phone] to advise what CC is sending to pharmacy.

## 2021-02-06 NOTE — Telephone Encounter (Signed)
Pt stated she has been on vyvanse a few days and it did not work.She wants to try the highest dose of concerta and only wants you to send in enough for her to try and see if it's effective.

## 2021-02-07 ENCOUNTER — Other Ambulatory Visit: Payer: Self-pay | Admitting: Psychiatry

## 2021-02-07 DIAGNOSIS — F411 Generalized anxiety disorder: Secondary | ICD-10-CM

## 2021-02-07 DIAGNOSIS — F6381 Intermittent explosive disorder: Secondary | ICD-10-CM

## 2021-02-11 ENCOUNTER — Telehealth: Payer: Self-pay | Admitting: Psychiatry

## 2021-02-11 NOTE — Telephone Encounter (Signed)
Pt stated vyvanse does not work at all and she wants to try one of the above meds instead.She is leaning more towards mydayis but worried bout the [place]

## 2021-02-11 NOTE — Telephone Encounter (Signed)
Pt said she would like to see what Dr Clovis Pu thinks about her taking,Mydayis , Concerta or Focalin.  Next appt 4/19

## 2021-02-12 NOTE — Telephone Encounter (Signed)
Pt called back again today and said that this is urgent and that she wants to have someone call her back today about the medication.. Phone number is [phone]

## 2021-02-12 NOTE — Telephone Encounter (Signed)
Pt called again

## 2021-02-15 NOTE — Telephone Encounter (Signed)
Chelsea Jackson called again toda\y.  She is still wanting a call back to discuss a change in her medication from the Adderall to Concerta.  She would like a prescription sent in for Concerta but a 4-5 day trial to see if it works.  You would have to decide on dosage.  She is frustrated no one will call her back.  She would like to talk with Dr. Clovis Pu, but would appreciate a call from the nurse as well to discuss how she can get this trial on Concerta started.

## 2021-02-15 NOTE — Telephone Encounter (Signed)
Pt called again

## 2021-02-19 NOTE — Telephone Encounter (Signed)
Do we need to do something with this message?

## 2021-02-20 ENCOUNTER — Other Ambulatory Visit: Payer: Self-pay | Admitting: Psychiatry

## 2021-02-20 NOTE — Telephone Encounter (Signed)
She picked up a month's worth of Adderall 20 mg tablets on January 17.  Then 1 week later picked up a month supply of Vyvanse on January 23.  She has taken Concerta in the past and did not like it.  I would be willing to let her try a few days of Concerta again in the future after I have a chance to obtain the paper chart and review it which could take several days.  But I would only be willing to do so if she brings in the unused Adderall and Vyvanse that she already has at home.  These are controlled substances and I cannot keep changing controlled substances and allowing her to accumulate some many of them at home.

## 2021-02-20 NOTE — Telephone Encounter (Signed)
Please get her paper chart to me

## 2021-02-21 ENCOUNTER — Telehealth: Payer: Self-pay | Admitting: Psychiatry

## 2021-02-21 NOTE — Telephone Encounter (Signed)
Thanks Chelsea Jackson.   Dr. Clovis Pu she is now requesting Mydayis and understands to bring in what she isn't using with her stimulants

## 2021-02-21 NOTE — Telephone Encounter (Signed)
No she doesn't need to be worked in. I believe he is waiting on her paper chart if he's not already received it.

## 2021-02-21 NOTE — Telephone Encounter (Signed)
Pt called back requesting an emergency appointment per CC. Advised I would sent message to Dr. Clovis Pu and advise Pt of out come today. Do I need to work in?

## 2021-02-21 NOTE — Telephone Encounter (Addendum)
RTC  Pt asked about what emergency.  She does not have an emergency but she wants another stimulant bc complaining of SE with Adderall and Vyvanse.  She had requested another prescription for Concerta.  She was informed she had been prescribed Concerta in the past when we were using paper charts and those notes were not available but she did not like it.  That was noted.  Then she wanted to be switched to [place].  It was explained to her that was inappropriate for her to make 9 different phone calls since January 23 seeking prescriptions for more stimulants when she has two 30-day prescriptions for stimulants prescribed since January 23.  Patient then became insulting of the doctor stating "you just want to push pills, that is what I have heard".  Pt was angry and inappropriate on the phone call. Discussed with patient that if that is patient's view of this physician that continuing our relationship was not appropriate for either of [place].  She will have to seek psychiatric care elsewhere.  She will be terminated from the practice.  She was informed verbally.  She understood.  Will send termination letter and provide refills of non-stimulants for the next 8 weeks.  Lynder Parents, MD, DFAPA

## 2021-02-21 NOTE — Telephone Encounter (Signed)
This is a continuation of the previous messages about changing her medication.  I told her that Dr. Clovis Pu was reviewing her chart.  She was wanting Concerta but he had notes that she did not like it when she tried it before.  I also told her he was not going to continue to prescription these various controlled substances unless she brought back her Adderall and Vyvanse that had recently picked up.  She said if she didn't like concerta before she probably won't like it again so there would no need to prescribe it.  Now she wants to try Mydayis with the understanding that she will need to bring her other medications.  Please call her today or now later than tomorrow to let her know what is decided.

## 2021-02-21 NOTE — Telephone Encounter (Deleted)
error 

## 2021-02-21 NOTE — Telephone Encounter (Signed)
Reviewed

## 2021-03-06 ENCOUNTER — Telehealth: Payer: Self-pay

## 2021-03-06 ENCOUNTER — Telehealth: Payer: Self-pay | Admitting: Internal Medicine

## 2021-03-06 NOTE — Telephone Encounter (Signed)
Copied from [place] [id number]. Topic: Appointment Scheduling - Scheduling Inquiry for Clinic ?>> Mar 06, 2021  2:15 PM Erick Blinks wrote: ?Reason for CRM: Pt called to report that she has been referred to see Dr. B by an existing patient. (April Ball) The patient says she really needs a PCP and has heard wonderful reviews of Dr. B ? ? Best contact: [phone] ? ? ?Pt understands that practice is not accepting new patients, insists on asking Dr. Jacinto Reap ?

## 2021-03-06 NOTE — Telephone Encounter (Signed)
Pt stated that she like to TOC from [place] to [place] [facility] ?

## 2021-03-06 NOTE — Telephone Encounter (Signed)
Pt would like to TOC to [place] cody from [employer] and pt stated she have referrel ?

## 2021-03-07 NOTE — Telephone Encounter (Signed)
Left detailed message for patient on voice mail advising that Dr. B is not taking new patients.  ?

## 2021-03-07 NOTE — Telephone Encounter (Signed)
I am currently not taking any new patients. April Ball (the other patient that referred her) is now seeing Ria Comment PA, who is open to new patients and may be able to accept this patient as a new patient. ?

## 2021-03-11 ENCOUNTER — Encounter: Payer: Self-pay | Admitting: Nurse Practitioner

## 2021-03-11 ENCOUNTER — Ambulatory Visit (INDEPENDENT_AMBULATORY_CARE_PROVIDER_SITE_OTHER): Payer: Managed Care, Other (non HMO) | Admitting: Nurse Practitioner

## 2021-03-11 ENCOUNTER — Other Ambulatory Visit: Payer: Self-pay

## 2021-03-11 VITALS — BP 116/74 | Ht 61.0 in | Wt 153.0 lb

## 2021-03-11 DIAGNOSIS — Z01419 Encounter for gynecological examination (general) (routine) without abnormal findings: Secondary | ICD-10-CM | POA: Diagnosis not present

## 2021-03-11 DIAGNOSIS — R102 Pelvic and perineal pain: Secondary | ICD-10-CM | POA: Diagnosis not present

## 2021-03-11 DIAGNOSIS — N898 Other specified noninflammatory disorders of vagina: Secondary | ICD-10-CM | POA: Diagnosis not present

## 2021-03-11 DIAGNOSIS — R5383 Other fatigue: Secondary | ICD-10-CM

## 2021-03-11 DIAGNOSIS — R351 Nocturia: Secondary | ICD-10-CM | POA: Diagnosis not present

## 2021-03-11 DIAGNOSIS — N912 Amenorrhea, unspecified: Secondary | ICD-10-CM | POA: Diagnosis not present

## 2021-03-11 DIAGNOSIS — E039 Hypothyroidism, unspecified: Secondary | ICD-10-CM

## 2021-03-11 DIAGNOSIS — Z3041 Encounter for surveillance of contraceptive pills: Secondary | ICD-10-CM | POA: Diagnosis not present

## 2021-03-11 LAB — WET PREP FOR TRICH, YEAST, CLUE

## 2021-03-11 LAB — PREGNANCY, URINE: Preg Test, Ur: NEGATIVE

## 2021-03-11 MED ORDER — NORETHINDRONE-ETH ESTRADIOL 1-35 MG-MCG PO TABS: 1.0000 | ORAL_TABLET | Freq: Every day | ORAL | 3 refills | Status: DC

## 2021-03-11 NOTE — Progress Notes (Signed)
? ?SERITA DEGROOTE [birth year] [id number] ? ? ?History:  36 y.o. G0 presents as new patient to establish care. Always had regular menses until about a year ago when they stopped. She was seen elsewhere for evaluation with negative workup. On OCPs. She also complains of intermittent vaginal odor without discharge. She has felt much more tired lately, even to the point of almost falling asleep while driving. She is self-weaning off of Zoloft. She has increased family stress, weight gain, and poor sleep. Normal pap history. She also reports nighttime urination occurring 2-4 times and feeling like she is not able to fully empty bladder. GAD, ADHD managed by behavioral health. Hypothyroidism, GERD previously managed by PCP. Plans to establish with new PCP soon. ? ?Gynecologic History ?No LMP recorded (lmp unknown). ?Period Duration (Days): 2 ?Period Pattern: (!) Irregular ?Menstrual Flow: Light ?Dysmenorrhea: None ?Contraception/Family planning: OCP (estrogen/progesterone) ?Sexually active: Yes ? ?Health Maintenance ?Last Pap: 2022. Results were: Normal per patient ?Last mammogram: Not indicated ?Last colonoscopy: Not indicated ?Last Dexa: Not indicated ? ?Past medical history, past surgical history, family history and social history were all reviewed and documented in the EPIC chart. Married. Owns cleaning business.  ? ?ROS:  A ROS was performed and pertinent positives and negatives are included. ? ?Exam: ? ?Vitals:  ? 03/11/21 1330  ?BP: 116/74  ?Weight: 153 lb (69.4 kg)  ?Height: '5\' 1"'$  (1.549 m)  ? ?Body mass index is 28.91 kg/m?. ? ?General appearance:  Normal ?Thyroid:  Symmetrical, normal in size, without palpable masses or nodularity. ?Respiratory ? Auscultation:  Clear without wheezing or rhonchi ?Cardiovascular ? Auscultation:  Regular rate, without rubs, murmurs or gallops ? Edema/varicosities:  Not grossly evident ?Abdominal ? Soft,nontender, without masses, guarding or rebound. ? Liver/spleen:  No organomegaly  noted ? Hernia:  None appreciated ? Skin ? Inspection:  Grossly normal ?Breasts: Examined lying and sitting.  ? Right: Without masses, retractions, nipple discharge or axillary adenopathy. ? ? Left: Without masses, retractions, nipple discharge or axillary adenopathy. ?Genitourinary  ? Inguinal/mons:  Normal without inguinal adenopathy ? External genitalia:  Normal appearing vulva with no masses, tenderness, or lesions ? BUS/Urethra/Skene's glands:  Normal ? Vagina:  Normal appearing with normal color and discharge, no lesions ? Cervix:  Normal appearing without discharge or lesions ? Uterus:  Normal in size, shape and contour.  Midline and mobile, nontender ? Adnexa/parametria:   ?  Rt: Normal in size, without masses or tenderness. ?  Lt: Normal in size, without masses or tenderness. ? Anus and perineum: Normal ? Digital rectal exam: Not indicated ? ?Patient informed chaperone available to be present for breast and pelvic exam. Patient has requested no chaperone to be present. Patient has been advised what will be completed during breast and pelvic exam.  ? ?Wet prep negative ? ?UA leukocytes 1+, negative nitrites, 1+ blood (spotting today), SG 1.015, pH 6.0, yellow/clear. Microscopic: wbc 6-10, rbc 3-10, few bacteria ? ?UPT negative ? ?Assessment/Plan:  36 y.o. G0 to establish care. ? ?Well female exam with routine gynecological exam - Plan: CBC with Differential/Platelet, Comprehensive metabolic panel. Education provided on SBEs, importance of preventative screenings, current guidelines, high calcium diet, regular exercise, and multivitamin daily.  Plans to establish with PCP soon but wanted labs done today.  ? ?Fatigue, unspecified type - Plan: CBC with Differential/Platelet, Comprehensive metabolic panel, TSH, Vitamin B12, VITAMIN D 25 Hydroxy (Vit-D Deficiency, Fractures). She has felt much more tired lately, even to the point of almost falling asleep while driving.  She is self-weaning off of Zoloft. She has  increased family stress, weight gain, and poor sleep. We discussed lifestyle changes to help with fatigue. Also recommend discussing medication changes with psychiatry before weaning self and she is agreeable.  ? ?Hypothyroidism, unspecified type - Plan: TSH. Levothyroxine 50 mcg daily. Reports taking daily but does not always take by itself.  ? ?Vaginal odor - Plan: WET PREP FOR [place], YEAST, CLUE. Negative wet prep.  ? ?Pelvic pressure in female - Plan: Urinalysis w microscopic + reflex cultur, Urine Culture. Culture pending.  ? ?Nocturia more than twice per night - reports urinating 2-4 times per night. Recommend avoiding fluids for 2-3 hours before bed. Also recommend double voiding, voiding more often during the day, and avoiding triggering foods/beverages.  ? ?Encounter for surveillance of contraceptive pills - Plan: norethindrone-ethinyl estradiol 1/35 (ORTHO-NOVUM) tablet daily. Amenorrheic. Will stop OCPs for 1 month and return for hormone panel. ? ?Amenorrhea - Plan: Pregnancy, urine. Negative.  ? ?Screening for cervical cancer - Normal Pap history.  Will repeat at 3-year interval per guidelines. ? ?Return in 1 year for annual.  ? ? ? ? ?Tamela Gammon DNP, 3:53 PM 03/11/2021 ? ?

## 2021-03-12 ENCOUNTER — Encounter: Payer: Self-pay | Admitting: Nurse Practitioner

## 2021-03-12 ENCOUNTER — Ambulatory Visit: Payer: Managed Care, Other (non HMO) | Admitting: Family

## 2021-03-12 DIAGNOSIS — N3000 Acute cystitis without hematuria: Secondary | ICD-10-CM

## 2021-03-12 LAB — CBC WITH DIFFERENTIAL/PLATELET
Absolute Monocytes: 667 cells/uL (ref 200–950)
Basophils Absolute: 56 cells/uL (ref 0–200)
Basophils Relative: 0.4 %
Eosinophils Absolute: 445 cells/uL (ref 15–500)
Eosinophils Relative: 3.2 %
HCT: 41.8 % (ref 35.0–45.0)
Hemoglobin: 14.3 g/dL (ref 11.7–15.5)
Lymphs Abs: 2808 cells/uL (ref 850–3900)
MCH: 31.8 pg (ref 27.0–33.0)
MCHC: 34.2 g/dL (ref 32.0–36.0)
MCV: 92.9 fL (ref 80.0–100.0)
MPV: 10.1 fL (ref 7.5–12.5)
Monocytes Relative: 4.8 %
Neutro Abs: 9925 cells/uL — ABNORMAL HIGH (ref 1500–7800)
Neutrophils Relative %: 71.4 %
Platelets: 373 10*3/uL (ref 140–400)
RBC: 4.5 10*6/uL (ref 3.80–5.10)
RDW: 12.5 % (ref 11.0–15.0)
Total Lymphocyte: 20.2 %
WBC: 13.9 10*3/uL — ABNORMAL HIGH (ref 3.8–10.8)

## 2021-03-12 LAB — COMPREHENSIVE METABOLIC PANEL
AG Ratio: 1.6 (calc) (ref 1.0–2.5)
ALT: 22 U/L (ref 6–29)
AST: 17 U/L (ref 10–30)
Albumin: 4.6 g/dL (ref 3.6–5.1)
Alkaline phosphatase (APISO): 72 U/L (ref 31–125)
BUN: 15 mg/dL (ref 7–25)
CO2: 21 mmol/L (ref 20–32)
Calcium: 10 mg/dL (ref 8.6–10.2)
Chloride: 106 mmol/L (ref 98–110)
Creat: 0.7 mg/dL (ref 0.50–0.97)
Globulin: 2.8 g/dL (calc) (ref 1.9–3.7)
Glucose, Bld: 103 mg/dL — ABNORMAL HIGH (ref 65–99)
Potassium: 4.3 mmol/L (ref 3.5–5.3)
Sodium: 137 mmol/L (ref 135–146)
Total Bilirubin: 0.3 mg/dL (ref 0.2–1.2)
Total Protein: 7.4 g/dL (ref 6.1–8.1)

## 2021-03-12 LAB — VITAMIN B12: Vitamin B-12: 360 pg/mL (ref 200–1100)

## 2021-03-12 LAB — VITAMIN D 25 HYDROXY (VIT D DEFICIENCY, FRACTURES): Vit D, 25-Hydroxy: 36 ng/mL (ref 30–100)

## 2021-03-12 LAB — TSH: TSH: 0.99 mIU/L

## 2021-03-13 ENCOUNTER — Encounter: Payer: Self-pay | Admitting: Family Medicine

## 2021-03-13 ENCOUNTER — Other Ambulatory Visit: Payer: Self-pay | Admitting: Nurse Practitioner

## 2021-03-13 DIAGNOSIS — N3000 Acute cystitis without hematuria: Secondary | ICD-10-CM

## 2021-03-13 MED ORDER — SULFAMETHOXAZOLE-TRIMETHOPRIM 800-160 MG PO TABS: 1.0000 | ORAL_TABLET | Freq: Two times a day (BID) | ORAL | 0 refills | Status: AC

## 2021-03-13 MED ORDER — SULFAMETHOXAZOLE-TRIMETHOPRIM 800-160 MG PO TABS: 1.0000 | ORAL_TABLET | Freq: Two times a day (BID) | ORAL | 0 refills | Status: DC

## 2021-03-13 NOTE — Telephone Encounter (Signed)
Chelsea Jackson patient is asking for yeast medication just in case she develops infection post antibiotic. I told her to let us know if she does via my chart.  ?

## 2021-03-14 LAB — URINALYSIS W MICROSCOPIC + REFLEX CULTURE
Bilirubin Urine: NEGATIVE
Casts: NONE SEEN /LPF
Crystals: NONE SEEN /HPF
Glucose, UA: NEGATIVE
Hyaline Cast: NONE SEEN /LPF
Ketones, ur: NEGATIVE
Nitrites, Initial: NEGATIVE
Protein, ur: NEGATIVE
Specific Gravity, Urine: 1.015 (ref 1.001–1.035)
Yeast: NONE SEEN /HPF
pH: 6 (ref 5.0–8.0)

## 2021-03-14 LAB — URINE CULTURE
MICRO NUMBER:: 13091737
SPECIMEN QUALITY:: ADEQUATE

## 2021-03-14 LAB — CULTURE INDICATED

## 2021-04-08 ENCOUNTER — Ambulatory Visit (INDEPENDENT_AMBULATORY_CARE_PROVIDER_SITE_OTHER): Payer: Managed Care, Other (non HMO) | Admitting: Family Medicine

## 2021-04-08 ENCOUNTER — Encounter: Payer: Self-pay | Admitting: Family Medicine

## 2021-04-08 VITALS — BP 110/62 | HR 70 | Temp 98.0°F | Ht 61.0 in | Wt 155.0 lb

## 2021-04-08 DIAGNOSIS — F419 Anxiety disorder, unspecified: Secondary | ICD-10-CM | POA: Diagnosis not present

## 2021-04-08 DIAGNOSIS — B009 Herpesviral infection, unspecified: Secondary | ICD-10-CM

## 2021-04-08 DIAGNOSIS — F988 Other specified behavioral and emotional disorders with onset usually occurring in childhood and adolescence: Secondary | ICD-10-CM | POA: Diagnosis not present

## 2021-04-08 DIAGNOSIS — E039 Hypothyroidism, unspecified: Secondary | ICD-10-CM | POA: Diagnosis not present

## 2021-04-08 DIAGNOSIS — D229 Melanocytic nevi, unspecified: Secondary | ICD-10-CM | POA: Insufficient documentation

## 2021-04-08 DIAGNOSIS — I7300 Raynaud's syndrome without gangrene: Secondary | ICD-10-CM | POA: Insufficient documentation

## 2021-04-08 DIAGNOSIS — K219 Gastro-esophageal reflux disease without esophagitis: Secondary | ICD-10-CM

## 2021-04-08 MED ORDER — LEVOTHYROXINE SODIUM 50 MCG PO TABS: 50.0000 ug | ORAL_TABLET | Freq: Every day | ORAL | 3 refills | Status: DC

## 2021-04-08 MED ORDER — VALACYCLOVIR HCL 500 MG PO TABS: 500.0000 mg | ORAL_TABLET | Freq: Every day | ORAL | 3 refills | Status: DC

## 2021-04-08 NOTE — Assessment & Plan Note (Signed)
On suppressive therapy. Cont valtrex 500 mg.  ?

## 2021-04-08 NOTE — Patient Instructions (Signed)
Chelsea Jackson is taking new patient as well ?I'm happy to see your husband too ?

## 2021-04-08 NOTE — Assessment & Plan Note (Signed)
Benign appearing mole. Advised updating in growing or changing.  ?

## 2021-04-08 NOTE — Assessment & Plan Note (Signed)
Lab Results  ?Component Value Date  ? TSH 0.99 03/11/2021  ? ?Controlled. Cont levothyroxine 50 mcg ?

## 2021-04-08 NOTE — Assessment & Plan Note (Signed)
Follows with psychiatry. Transitioning from zoloft to pristiq 25 mg. Also on clonazepam 0.5 mg up to 1-2 times a week.  ?

## 2021-04-08 NOTE — Assessment & Plan Note (Signed)
Stable on omeprazole 40 mg. Working on weight loss.  ?

## 2021-04-08 NOTE — Progress Notes (Signed)
? ?Subjective:  ? ?  ?Chelsea Jackson is a 36 y.o. female presenting for No chief complaint on file. ?  ? ? ?HPI ? ?#Herpes ?- endorses a lot of stress ?- a few times a years  ?- but not sure if she has a recognizable prodrome ? ?Anxiety/ADD ?- follows with psych ?- feels she is dong well  ? ?Review of Systems ? ? ?Social History  ? ?Tobacco Use  ?Smoking Status Some Days  ? Packs/day: 0.25  ? Years: 0.50  ? Pack years: 0.13  ? Types: Cigarettes, E-cigarettes  ?Smokeless Tobacco Never  ? ? ? ?   ?Objective:  ?  ?BP Readings from Last 3 Encounters:  ?04/08/21 110/62  ?03/11/21 116/74  ?11/06/16 118/61  ? ?Wt Readings from Last 3 Encounters:  ?04/08/21 155 lb (70.3 kg)  ?03/11/21 153 lb (69.4 kg)  ?11/06/16 158 lb (71.7 kg)  ? ? ?BP 110/62   Pulse 70   Temp 98 ?F (36.7 ?C) (Oral)   Ht '5\' 1"'$  (1.549 m)   Wt 155 lb (70.3 kg)   LMP  (LMP Unknown)   SpO2 98%   BMI 29.29 kg/m?  ? ? ?Physical Exam ?Constitutional:   ?   General: She is not in acute distress. ?   Appearance: She is well-developed. She is not diaphoretic.  ?HENT:  ?   Right Ear: External ear normal.  ?   Left Ear: External ear normal.  ?   Nose: Nose normal.  ?Eyes:  ?   Conjunctiva/sclera: Conjunctivae normal.  ?Cardiovascular:  ?   Rate and Rhythm: Normal rate and regular rhythm.  ?   Heart sounds: No murmur heard. ?Pulmonary:  ?   Effort: Pulmonary effort is normal. No respiratory distress.  ?   Breath sounds: Normal breath sounds. No wheezing.  ?Musculoskeletal:  ?   Cervical back: Neck supple.  ?Skin: ?   General: Skin is warm and dry.  ?   Capillary Refill: Capillary refill takes less than 2 seconds.  ?   Comments: Raised flesh colored mole on left axilla  ?Neurological:  ?   Mental Status: She is alert. Mental status is at baseline.  ?Psychiatric:     ?   Mood and Affect: Mood normal.     ?   Behavior: Behavior normal.  ? ? ? ? ? ?   ?Assessment & Plan:  ? ?Problem List Items Addressed This Visit   ? ?  ? Digestive  ? GERD (gastroesophageal reflux  disease)  ?  Stable on omeprazole 40 mg. Working on weight loss.  ?  ?  ?  ? Endocrine  ? Hypothyroidism  ?  Lab Results  ?Component Value Date  ? TSH 0.99 03/11/2021  ?Controlled. Cont levothyroxine 50 mcg ?  ?  ? Relevant Medications  ? levothyroxine (SYNTHROID) 50 MCG tablet  ?  ? Musculoskeletal and Integument  ? Benign mole  ?  Benign appearing mole. Advised updating in growing or changing.  ?  ?  ?  ? Other  ? ADD (attention deficit disorder)  ?  Well controlled on adderall 20 mg ?  ?  ? 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  On suppressive therapy. Cont valtrex 500 mg.  ?  ?  ? Relevant Medications  ? valACYclovir (VALTREX) 500 MG tablet  ? Anxiety  ?  Follows with psychiatry. Transitioning from zoloft to pristiq 25 mg. Also on clonazepam 0.5 mg up to 1-2 times  a week.  ?  ?  ? Relevant Medications  ? desvenlafaxine (PRISTIQ) 25 MG 24 hr tablet  ? ? ? ?Return in about 1 year (around 04/09/2022) for annual . ? ?Lesleigh Noe, MD ? ?This visit occurred during the SARS-CoV-2 public health emergency.  Safety protocols were in place, including screening questions prior to the visit, additional usage of staff PPE, and extensive cleaning of exam room while observing appropriate contact time as indicated for disinfecting solutions.  ? ?

## 2021-04-08 NOTE — Assessment & Plan Note (Signed)
Well controlled on adderall 20 mg ?

## 2021-04-16 ENCOUNTER — Encounter: Payer: Self-pay | Admitting: Family Medicine

## 2021-04-24 ENCOUNTER — Ambulatory Visit: Payer: 59 | Admitting: Psychiatry

## 2021-05-29 ENCOUNTER — Encounter: Payer: Self-pay | Admitting: Family Medicine

## 2021-05-29 ENCOUNTER — Encounter: Payer: Self-pay | Admitting: Nurse Practitioner

## 2021-08-16 ENCOUNTER — Encounter (INDEPENDENT_AMBULATORY_CARE_PROVIDER_SITE_OTHER): Payer: Managed Care, Other (non HMO) | Admitting: Family Medicine

## 2021-08-16 DIAGNOSIS — N898 Other specified noninflammatory disorders of vagina: Secondary | ICD-10-CM | POA: Diagnosis not present

## 2021-08-20 NOTE — Addendum Note (Signed)
Addended by: Lesleigh Noe on: 08/20/2021 02:29 PM   Modules accepted: Orders

## 2021-08-21 MED ORDER — BORIC ACID VAGINAL 600 MG VA SUPP: 600.0000 mg | Freq: Every evening | VAGINAL | 0 refills | Status: AC

## 2021-08-21 NOTE — Telephone Encounter (Signed)
Please see the MyChart message reply(ies) for my assessment and plan.  The patient gave consent for this Medical Advice Message and is aware that it may result in a bill to their insurance company as well as the possibility that this may result in a co-payment or deductible. They are an established patient, but are not seeking medical advice exclusively about a problem treated during an in person or video visit in the last 7 days. I did not recommend an in person or video visit within 7 days of my reply.  I spent a total of 8 minutes cumulative time within 7 days through MyChart messaging Chelsea Noe, MD   Suspect vaginal odor treatment 7 days of boric acid sent Last BV was >4 months ago Recommend f/u if not improved.

## 2021-08-21 NOTE — Addendum Note (Signed)
Addended by: Lesleigh Noe on: 08/21/2021 08:52 AM   Modules accepted: Orders

## 2021-08-26 ENCOUNTER — Encounter: Payer: Self-pay | Admitting: Family

## 2021-08-28 NOTE — Telephone Encounter (Signed)
Spoke to pt wife and she said he wants a referral and that you seen him last time for this problem.

## 2021-08-28 NOTE — Telephone Encounter (Signed)
Pt husband has an appt on 8/25 with [facility]

## 2021-10-15 ENCOUNTER — Ambulatory Visit (INDEPENDENT_AMBULATORY_CARE_PROVIDER_SITE_OTHER): Payer: Managed Care, Other (non HMO) | Admitting: Family

## 2021-10-15 ENCOUNTER — Encounter: Payer: Self-pay | Admitting: Family

## 2021-10-15 VITALS — BP 108/76 | HR 73 | Temp 98.6°F | Resp 16 | Ht 61.0 in | Wt 151.2 lb

## 2021-10-15 DIAGNOSIS — F902 Attention-deficit hyperactivity disorder, combined type: Secondary | ICD-10-CM

## 2021-10-15 DIAGNOSIS — E039 Hypothyroidism, unspecified: Secondary | ICD-10-CM | POA: Diagnosis not present

## 2021-10-15 DIAGNOSIS — D72829 Elevated white blood cell count, unspecified: Secondary | ICD-10-CM | POA: Diagnosis not present

## 2021-10-15 DIAGNOSIS — B009 Herpesviral infection, unspecified: Secondary | ICD-10-CM

## 2021-10-15 DIAGNOSIS — S8992XA Unspecified injury of left lower leg, initial encounter: Secondary | ICD-10-CM

## 2021-10-15 DIAGNOSIS — F6381 Intermittent explosive disorder: Secondary | ICD-10-CM

## 2021-10-15 DIAGNOSIS — F411 Generalized anxiety disorder: Secondary | ICD-10-CM | POA: Diagnosis not present

## 2021-10-15 DIAGNOSIS — K219 Gastro-esophageal reflux disease without esophagitis: Secondary | ICD-10-CM

## 2021-10-15 LAB — CBC WITH DIFFERENTIAL/PLATELET
Basophils Absolute: 0.1 10*3/uL (ref 0.0–0.1)
Basophils Relative: 0.7 % (ref 0.0–3.0)
Eosinophils Absolute: 0.4 10*3/uL (ref 0.0–0.7)
Eosinophils Relative: 4.1 % (ref 0.0–5.0)
HCT: 44 % (ref 36.0–46.0)
Hemoglobin: 15 g/dL (ref 12.0–15.0)
Lymphocytes Relative: 25.4 % (ref 12.0–46.0)
Lymphs Abs: 2.5 10*3/uL (ref 0.7–4.0)
MCHC: 34 g/dL (ref 30.0–36.0)
MCV: 94.9 fl (ref 78.0–100.0)
Monocytes Absolute: 0.5 10*3/uL (ref 0.1–1.0)
Monocytes Relative: 5.1 % (ref 3.0–12.0)
Neutro Abs: 6.4 10*3/uL (ref 1.4–7.7)
Neutrophils Relative %: 64.7 % (ref 43.0–77.0)
Platelets: 368 10*3/uL (ref 150.0–400.0)
RBC: 4.64 Mil/uL (ref 3.87–5.11)
RDW: 12.3 % (ref 11.5–15.5)
WBC: 9.9 10*3/uL (ref 4.0–10.5)

## 2021-10-15 LAB — TSH: TSH: 1.05 u[IU]/mL (ref 0.35–5.50)

## 2021-10-15 NOTE — Assessment & Plan Note (Signed)
Continue Valtrex 

## 2021-10-15 NOTE — Assessment & Plan Note (Signed)
Patient advised to continue follow-up with psychiatry as scheduled

## 2021-10-15 NOTE — Progress Notes (Signed)
Established Patient Office Visit  Subjective:  Patient ID: Chelsea Jackson, female    DOB: 07-Jul-1985  Age: 36 y.o. MRN: [id number]  CC:  Chief Complaint  Patient presents with   Establish Care    HPI Myrene Bougher Tseng is here for a transition of care visit.  Prior provider was: Waunita Schooner , MD  Pt is without acute concerns.   Left interior knee pain, fell in the shower while cleaning at someone's house, with some tenderness. This was about one month ago. Tender at times, hurts with walking at times. Ice occasionally.   chronic concerns:  GAD, impulsivity, ADHD: seen regularly by psychiatry.  She does state that she is currently in transition on starting on Effexor and she was taking sertraline prior.  She also takes Adderall 30 mg for ADD HD.  Hypothyroid: Stable on levothyroxine 50 mcg once daily however she does admit that she takes this with all of her other medications including Prilosec.   Past Medical History:  Diagnosis Date   Anxiety    Bronchitis    GERD (gastroesophageal reflux disease)    Herpes    Thyroid disease     History reviewed. No pertinent surgical history.  Family History  Problem Relation Age of Onset   Leukemia Mother    Depression Father    Drug abuse Sister    Aneurysm Sister    ADD / ADHD Maternal Grandmother    Depression Maternal Grandmother    Learning disabilities Maternal Grandmother    Alcohol abuse Maternal Grandfather    Depression Paternal Grandmother    Breast cancer Paternal Aunt        older age dx    Social History   Socioeconomic History   Marital status: Married    Spouse name: Herbie Baltimore   Number of children: 1   Years of education: Not on file   Highest education level: Not on file  Occupational History   Occupation: house cleaning buisness  Tobacco Use   Smoking status: Former    Packs/day: 0.25    Years: 0.50    Total pack years: 0.13    Types: Cigarettes, E-cigarettes   Smokeless tobacco: Never  Vaping Use    Vaping Use: Never used  Substance and Sexual Activity   Alcohol use: No   Drug use: No   Sexual activity: Yes    Partners: Male    Birth control/protection: Pill  Other Topics Concern   Not on file  Social History Narrative   04/08/21   From: the area   Living: with husband, Herbie Baltimore (2014) and nephew - Oswaldo Milian (2017)   Work: Scientist, product/process development - own business      Family: nephew - guardian, pets      Enjoys: church, relationship with God      Exercise: running and biking, zumba, spin bike   Diet: generally healthy      Safety   Seat belts: Yes    Guns: Yes  and secure   Safe in relationships: Yes       Social Determinants of Radio broadcast assistant Strain: Not on file  Food Insecurity: Not on file  Transportation Needs: Not on file  Physical Activity: Not on file  Stress: Not on file  Social Connections: Not on file  Intimate Partner Violence: Not on file    Outpatient Medications Prior to Visit  Medication Sig Dispense Refill   amphetamine-dextroamphetamine (ADDERALL) 20 MG tablet Take 30 mg by mouth 2 (two)  times daily. 30 mg once a day and a 20 mg once a day     Biotin 1 MG CAPS Take by mouth.     Cholecalciferol (VITAMIN D3) 1000 units CAPS Take by mouth.     clonazePAM (KLONOPIN) 0.5 MG tablet Take 0.5 mg by mouth 2 (two) times daily as needed for anxiety.     cyanocobalamin (VITAMIN B12) 1000 MCG tablet Take 1,000 mcg by mouth daily.     levothyroxine (SYNTHROID) 50 MCG tablet Take 1 tablet (50 mcg total) by mouth daily before breakfast. 90 tablet 3   Multiple Vitamins-Minerals (MULTIVITAMIN ADULT PO) Take by mouth daily.     norethindrone-ethinyl estradiol 1/35 (ORTHO-NOVUM) tablet Take 1 tablet by mouth daily. 84 tablet 3   omeprazole (PRILOSEC) 40 MG capsule TAKE 1 CAPSULE BY MOUTH TWICE A DAY 180 capsule 0   OXcarbazepine (TRILEPTAL) 150 MG tablet Take by mouth.     valACYclovir (VALTREX) 500 MG tablet Take 1 tablet (500 mg total) by mouth daily. 90 tablet 3    venlafaxine XR (EFFEXOR-XR) 37.5 MG 24 hr capsule Take 37.5 mg by mouth 2 (two) times daily.     WEGOVY 0.5 MG/0.5ML SOAJ Inject into the skin.     sertraline (ZOLOFT) 100 MG tablet Take 2 tablets (200 mg total) by mouth daily. 180 tablet 0   desvenlafaxine (PRISTIQ) 25 MG 24 hr tablet Take 25 mg by mouth daily. (Patient not taking: Reported on 10/15/2021)     0.9 %  sodium chloride infusion      No facility-administered medications prior to visit.    No Known Allergies      Objective:    Physical Exam Vitals reviewed.  Constitutional:      General: She is not in acute distress.    Appearance: Normal appearance. She is normal weight. She is not ill-appearing, toxic-appearing or diaphoretic.  HENT:     Right Ear: Tympanic membrane normal.     Left Ear: Tympanic membrane normal.     Mouth/Throat:     Mouth: Mucous membranes are moist.     Pharynx: No pharyngeal swelling.     Tonsils: No tonsillar exudate.  Eyes:     Extraocular Movements: Extraocular movements intact.     Conjunctiva/sclera: Conjunctivae normal.     Pupils: Pupils are equal, round, and reactive to light.  Neck:     Thyroid: No thyroid mass.  Cardiovascular:     Rate and Rhythm: Normal rate and regular rhythm.  Pulmonary:     Effort: Pulmonary effort is normal.     Breath sounds: Normal breath sounds.  Abdominal:     General: Abdomen is flat. Bowel sounds are normal.     Palpations: Abdomen is soft.  Musculoskeletal:        General: Normal range of motion.     Right knee: Normal.     Left knee: Bony tenderness (right lower outer meniscal tenderness on palpation) present. No swelling or effusion. Normal range of motion.     Instability Tests: Anterior drawer test negative.  Lymphadenopathy:     Cervical:     Right cervical: No superficial cervical adenopathy.    Left cervical: No superficial cervical adenopathy.  Skin:    General: Skin is warm.     Capillary Refill: Capillary refill takes less than 2  seconds.  Neurological:     General: No focal deficit present.     Mental Status: She is alert and oriented to person, place, and time.  Psychiatric:        Mood and Affect: Mood normal.        Speech: Speech normal.        Behavior: Behavior is hyperactive. Behavior is not aggressive. Behavior is cooperative.        Thought Content: Thought content normal. Thought content does not include homicidal or suicidal ideation. Thought content does not include suicidal plan.        Cognition and Memory: Cognition and memory normal.        Judgment: Judgment normal.       BP 108/76   Pulse 73   Temp 98.6 F (37 C)   Resp 16   Ht '5\' 1"'$  (1.549 m)   Wt 151 lb 4 oz (68.6 kg)   SpO2 97%   BMI 28.58 kg/m  Wt Readings from Last 3 Encounters:  10/15/21 151 lb 4 oz (68.6 kg)  04/08/21 155 lb (70.3 kg)  03/11/21 153 lb (69.4 kg)     Health Maintenance Due  Topic Date Due   COVID-19 Vaccine (1) Never done   HIV Screening  Never done   Hepatitis C Screening  Never done   TETANUS/TDAP  Never done   PAP SMEAR-Modifier  Never done   INFLUENZA VACCINE  Never done    There are no preventive care reminders to display for this patient.  Lab Results  Component Value Date   TSH 0.99 03/11/2021   Lab Results  Component Value Date   WBC 13.9 (H) 03/11/2021   HGB 14.3 03/11/2021   HCT 41.8 03/11/2021   MCV 92.9 03/11/2021   PLT 373 03/11/2021   Lab Results  Component Value Date   NA 137 03/11/2021   K 4.3 03/11/2021   CO2 21 03/11/2021   GLUCOSE 103 (H) 03/11/2021   BUN 15 03/11/2021   CREATININE 0.70 03/11/2021   BILITOT 0.3 03/11/2021   AST 17 03/11/2021   ALT 22 03/11/2021   PROT 7.4 03/11/2021   CALCIUM 10.0 03/11/2021   No results found for: "CHOL" No results found for: "HDL" No results found for: "LDLCALC" No results found for: "TRIG" No results found for: "CHOLHDL" No results found for: "HGBA1C"    Assessment & Plan:   Problem List Items Addressed This Visit        Digestive   GERD (gastroesophageal reflux disease)    Recommended patient take Prilosec in the morning prior to breakfast and separate 4 hours from her thyroid medication Try to decrease and or avoid spicy foods, fried fatty foods, and also caffeine and chocolate as these can increase heartburn symptoms.          Endocrine   Hypothyroidism - Primary    Advised patient to take her levothyroxine separate from other foods and medications for 30 minutes to 1 hour and separate 4 hours at least from Prilosec and vitamins.  TSH ordered today.  Keep in mind the patient also taking biotin in regards to the lab results      Relevant Orders   TSH     Other   ADD (attention deficit disorder)   Herpes simplex type 2 infection    Continue Valtrex      Leukocytosis   Relevant Orders   CBC with Differential   Injury of left knee    Patient declines x-ray today advised to wear daily compression sock to give support  Ice as needed recommend Voltaren gel over-the-counter      GAD (generalized anxiety disorder)  Patient advised to continue follow-up with psychiatry as scheduled      Relevant Medications   venlafaxine XR (EFFEXOR-XR) 37.5 MG 24 hr capsule   Intermittent explosive disorder    No orders of the defined types were placed in this encounter.   Follow-up: Return in about 6 months (around 04/16/2022) for f/u CPE .    Eugenia Pancoast, FNP

## 2021-10-15 NOTE — Assessment & Plan Note (Signed)
Recommended patient take Prilosec in the morning prior to breakfast and separate 4 hours from her thyroid medication Try to decrease and or avoid spicy foods, fried fatty foods, and also caffeine and chocolate as these can increase heartburn symptoms.

## 2021-10-15 NOTE — Patient Instructions (Addendum)
  Recommend voltaren gel for your knee as well as wearing a compression sleeve daily.   Welcome to our clinic, I am happy to have you as my new patient. I am excited to continue on this healthcare journey with you.  Look into geneticist.   SWITCH GERD MEDICATION (OMEPRAZOLE ) TO AM ONLY, AND CHANGE THYROID MEDICATION TO RIGHT BEFORE BED. YOU WANT TO MAKE SURE YOU DO NOT TAKE THYROID MEDICINE WITHIN 30 MINUTES TO ONE HOUR OF ANOTHER MEDICATION AND OR FOOD.   Stop by the lab prior to leaving today. I will notify you of your results once received.   Please keep in mind Any my chart messages you send have up to a three business day turnaround for a response.  Phone calls may take up to a one full business day turnaround for a  response.   If you need a medication refill I recommend you request it through the pharmacy as this is easiest for [place] rather than sending a message and or phone call.   Due to recent changes in healthcare laws, you may see results of your imaging and/or laboratory studies on MyChart before I have had a chance to review them.  I understand that in some cases there may be results that are confusing or concerning to you. Please understand that not all results are received at the same time and often I may need to interpret multiple results in order to provide you with the best plan of care or course of treatment. Therefore, I ask that you please give me 2 business days to thoroughly review all your results before contacting my office for clarification. Should we see a critical lab result, you will be contacted sooner.   It was a pleasure seeing you today! Please do not hesitate to reach out with any questions and or concerns.  Regards,   Dollie Mayse FNP-C   Regards,   Eugenia Pancoast FNP-C

## 2021-10-15 NOTE — Assessment & Plan Note (Signed)
Advised patient to take her levothyroxine separate from other foods and medications for 30 minutes to 1 hour and separate 4 hours at least from Prilosec and vitamins.  TSH ordered today.  Keep in mind the patient also taking biotin in regards to the lab results

## 2021-10-15 NOTE — Assessment & Plan Note (Signed)
Patient declines x-ray today advised to wear daily compression sock to give support  Ice as needed recommend Voltaren gel over-the-counter

## 2021-12-17 ENCOUNTER — Encounter: Payer: Self-pay | Admitting: Family

## 2021-12-18 NOTE — Telephone Encounter (Signed)
I spoke with pt; pt said has had symptoms about 1 wk and pt has been taking mucinex with no improvement. Pt has head and chest congestion; pt has prod cough with yellow/green phlegm. Pt has had slight H/A on and off with last H/A one day last wk. Pt has sore throat when swallows with raspy voice. Pt has not taken temp. No CP,SOB,and no dizziness. Pt has not taken covid test. Pt will take covid test now and if + pt will cb and change appt to [facility]. Pt has no hx of pneumonia. UC & ED precautions given and pt voiced understanding.pt scheduled appt with Dr Lorelei Pont on 12/19/21 at 3:20. Sending note to Dr Lorelei Pont and [place] pool.

## 2021-12-19 ENCOUNTER — Ambulatory Visit (INDEPENDENT_AMBULATORY_CARE_PROVIDER_SITE_OTHER): Payer: Managed Care, Other (non HMO) | Admitting: Family Medicine

## 2021-12-19 ENCOUNTER — Encounter: Payer: Self-pay | Admitting: Family Medicine

## 2021-12-19 VITALS — BP 112/70 | HR 80 | Temp 98.8°F | Ht 61.0 in | Wt 156.5 lb

## 2021-12-19 DIAGNOSIS — R0981 Nasal congestion: Secondary | ICD-10-CM

## 2021-12-19 DIAGNOSIS — J208 Acute bronchitis due to other specified organisms: Secondary | ICD-10-CM

## 2021-12-19 DIAGNOSIS — R051 Acute cough: Secondary | ICD-10-CM | POA: Diagnosis not present

## 2021-12-19 MED ORDER — DOXYCYCLINE HYCLATE 100 MG PO TABS: 100.0000 mg | ORAL_TABLET | Freq: Two times a day (BID) | ORAL | 0 refills | Status: DC

## 2021-12-19 NOTE — Progress Notes (Signed)
Alvetta Hidrogo T. [place]na [place], MD, [place] at [facility] [place], [address]  Phone: [phone]  FAX: [phone]  Chelsea Jackson - 36 y.o. female  MRN [id number]  Date of Birth: October 12, 1985  Date: 12/19/2021  PCP: Eugenia Pancoast, FNP  Referral: Eugenia Pancoast, FNP  Chief Complaint  Patient presents with   Cough    With chest congestion with green phlegm Going on 2 weeks now   Subjective:   Chelsea Jackson is a 36 y.o. very pleasant female patient with Body mass index is 29.57 kg/m. who presents with the following:  Patient presents for an acute cough and congestion.  This very pleasant young lady presents and she has had some significant chest congestion and coughing that is productive of greenish sputum for roughly 2 weeks.  Initially, she did have a sore throat and some laryngitis, but this is largely gotten better with the exception of her chest.  She does feel globally fatigued.  She does not have any sinus pressure or pain.  No earache. No nausea, vomiting, diarrhea. She does not have any neurological complaints including no loss of taste or smell.  She did not do a home COVID test.  2 weeks - congestion, coughing Sore throat and really bad -   Has lost her voice some.  Tired  No fever.    Review of Systems is noted in the HPI, as appropriate  Objective:   BP 112/70   Pulse 80   Temp 98.8 F (37.1 C) (Oral)   Ht '5\' 1"'$  (1.549 m)   Wt 156 lb 8 oz (71 kg)   SpO2 97%   BMI 29.57 kg/m    Gen: WDWN, NAD. Globally Non-toxic HEENT: Throat clear, w/o exudate, R TM clear, L TM - good landmarks, No fluid present. rhinnorhea.  MMM Frontal sinuses: NT Max sinuses: NT NECK: Anterior cervical  LAD is absent CV: RRR, No M/G/R, cap refill <2 sec PULM: Breathing comfortably in no respiratory distress. no wheezing, crackles, rhonchi   Laboratory and Imaging Data:  Assessment and Plan:     ICD-10-CM   1. Acute  bronchitis due to other specified organisms  J20.8     2. Acute cough  R05.1     3. Nasal congestion  R09.81      Greater than 2 weeks history of productive cough that has not improved at all.  Given the length of time it is probably reasonable to cover her with some antibiotics to cover for any atypicals.  Medication Management during today's office visit: Meds ordered this encounter  Medications   doxycycline (VIBRA-TABS) 100 MG tablet    Sig: Take 1 tablet (100 mg total) by mouth 2 (two) times daily.    Dispense:  20 tablet    Refill:  0   [employer] One speech-to-text software was used for transcription in this dictation.  Possible transcriptional errors can occur using Editor, commissioning.   Signed,  Maud Deed. Malory Spurr, MD   Outpatient Encounter Medications as of 12/19/2021  Medication Sig   amphetamine-dextroamphetamine (ADDERALL XR) 30 MG 24 hr capsule Take 30 mg by mouth in the morning and at bedtime.   Biotin 1 MG CAPS Take by mouth.   Cholecalciferol (VITAMIN D3) 1000 units CAPS Take by mouth.   clonazePAM (KLONOPIN) 1 MG tablet Take 1 mg by mouth at bedtime as needed.   cyanocobalamin (VITAMIN B12) 1000 MCG tablet Take 1,000 mcg by mouth  daily.   doxycycline (VIBRA-TABS) 100 MG tablet Take 1 tablet (100 mg total) by mouth 2 (two) times daily.   lamoTRIgine (LAMICTAL) 25 MG tablet Take 50 mg by mouth daily.   levothyroxine (SYNTHROID) 50 MCG tablet Take 1 tablet (50 mcg total) by mouth daily before breakfast.   Multiple Vitamins-Minerals (MULTIVITAMIN ADULT PO) Take by mouth daily.   norethindrone-ethinyl estradiol 1/35 (ALAYCEN 1/35) tablet Take 1 tablet by mouth daily.   omeprazole (PRILOSEC) 40 MG capsule Take 40 mg by mouth daily.   valACYclovir (VALTREX) 500 MG tablet Take 1 tablet (500 mg total) by mouth daily.   venlafaxine XR (EFFEXOR-XR) 75 MG 24 hr capsule Take 75 mg by mouth daily.   [DISCONTINUED] amphetamine-dextroamphetamine (ADDERALL) 20 MG tablet Take  30 mg by mouth 2 (two) times daily. 30 mg once a day and a 20 mg once a day   [DISCONTINUED] clonazePAM (KLONOPIN) 0.5 MG tablet Take 0.5 mg by mouth 2 (two) times daily as needed for anxiety.   [DISCONTINUED] norethindrone-ethinyl estradiol 1/35 (ORTHO-NOVUM) tablet Take 1 tablet by mouth daily.   [DISCONTINUED] omeprazole (PRILOSEC) 40 MG capsule TAKE 1 CAPSULE BY MOUTH TWICE A DAY (Patient taking differently: Take 40 mg by mouth daily.)   [DISCONTINUED] OXcarbazepine (TRILEPTAL) 150 MG tablet Take by mouth.   [DISCONTINUED] venlafaxine XR (EFFEXOR-XR) 37.5 MG 24 hr capsule Take 37.5 mg by mouth 2 (two) times daily.   [DISCONTINUED] WEGOVY 0.5 MG/0.5ML SOAJ Inject into the skin.   No facility-administered encounter medications on file as of 12/19/2021.

## 2022-01-28 ENCOUNTER — Other Ambulatory Visit: Payer: Self-pay | Admitting: Nurse Practitioner

## 2022-01-28 DIAGNOSIS — Z3041 Encounter for surveillance of contraceptive pills: Secondary | ICD-10-CM

## 2022-01-28 NOTE — Telephone Encounter (Signed)
I spoke with patient. She does not want to schedule AEX here as she wants to check with her PCP and see if she can fill the Rx for her.  She asked me to deny the refill and she will call us back if she needs visit/refill.  Refill denied.

## 2022-01-28 NOTE — Telephone Encounter (Signed)
Last AEX 03/11/21. Message sent to appt desk pool to please schedule AEX prior to refill.

## 2022-01-29 ENCOUNTER — Encounter: Payer: Self-pay | Admitting: Family

## 2022-01-31 MED ORDER — ALYACEN 1/35 1-35 MG-MCG PO TABS: 1.0000 | ORAL_TABLET | Freq: Every day | ORAL | 5 refills | Status: DC

## 2022-03-11 ENCOUNTER — Encounter: Payer: Self-pay | Admitting: Family

## 2022-03-13 ENCOUNTER — Ambulatory Visit: Payer: Managed Care, Other (non HMO) | Admitting: Family

## 2022-03-13 ENCOUNTER — Encounter: Payer: Self-pay | Admitting: Family

## 2022-03-13 VITALS — BP 122/82 | HR 68 | Temp 97.8°F | Ht 61.0 in | Wt 156.4 lb

## 2022-03-13 DIAGNOSIS — Z20822 Contact with and (suspected) exposure to covid-19: Secondary | ICD-10-CM

## 2022-03-13 DIAGNOSIS — Z20828 Contact with and (suspected) exposure to other viral communicable diseases: Secondary | ICD-10-CM | POA: Diagnosis not present

## 2022-03-13 DIAGNOSIS — J029 Acute pharyngitis, unspecified: Secondary | ICD-10-CM

## 2022-03-13 DIAGNOSIS — J020 Streptococcal pharyngitis: Secondary | ICD-10-CM | POA: Diagnosis not present

## 2022-03-13 LAB — POCT INFLUENZA A/B
Influenza A, POC: NEGATIVE
Influenza B, POC: NEGATIVE

## 2022-03-13 LAB — POC COVID19 BINAXNOW: SARS Coronavirus 2 Ag: NEGATIVE

## 2022-03-13 LAB — POCT RAPID STREP A (OFFICE): Rapid Strep A Screen: POSITIVE — AB

## 2022-03-13 MED ORDER — AMOXICILLIN 875 MG PO TABS: 875.0000 mg | ORAL_TABLET | Freq: Two times a day (BID) | ORAL | 0 refills | Status: AC

## 2022-03-13 NOTE — Progress Notes (Signed)
Established Patient Office Visit  Subjective:   Patient ID: Chelsea Jackson, female    DOB: February 14, 1985  Age: 37 y.o. MRN: [id number]  CC:  Chief Complaint  Patient presents with   Cough    HPI: Chelsea Jackson is a 37 y.o. female presenting on 03/13/2022 for Cough   Cough    Six days ago started with cough, chest congestion, runny nose, and headaches.  She has a slight sore throat as well which is improving. No fever or chills.  She did note some tenderness in her back when she was coughing, also productive, now clear was green.         ROS: Negative unless specifically indicated above in HPI.   Relevant past medical history reviewed and updated as indicated.   Allergies and medications reviewed and updated.   Current Outpatient Medications:    amoxicillin (AMOXIL) 875 MG tablet, Take 1 tablet (875 mg total) by mouth 2 (two) times daily for 10 days., Disp: 20 tablet, Rfl: 0   amphetamine-dextroamphetamine (ADDERALL XR) 30 MG 24 hr capsule, Take 30 mg by mouth in the morning and at bedtime., Disp: , Rfl:    Biotin 1 MG CAPS, Take by mouth., Disp: , Rfl:    Cholecalciferol (VITAMIN D3) 1000 units CAPS, Take by mouth., Disp: , Rfl:    clonazePAM (KLONOPIN) 1 MG tablet, Take 1 mg by mouth at bedtime as needed., Disp: , Rfl:    cyanocobalamin (VITAMIN B12) 1000 MCG tablet, Take 1,000 mcg by mouth daily., Disp: , Rfl:    doxycycline (VIBRA-TABS) 100 MG tablet, Take 1 tablet (100 mg total) by mouth 2 (two) times daily., Disp: 20 tablet, Rfl: 0   lamoTRIgine (LAMICTAL) 25 MG tablet, Take 50 mg by mouth daily., Disp: , Rfl:    Multiple Vitamins-Minerals (MULTIVITAMIN ADULT PO), Take by mouth daily., Disp: , Rfl:    norethindrone-ethinyl estradiol 1/35 (ALAYCEN 1/35) tablet, Take 1 tablet by mouth daily., Disp: 28 tablet, Rfl: 5   omeprazole (PRILOSEC) 40 MG capsule, Take 40 mg by mouth daily., Disp: , Rfl:    valACYclovir (VALTREX) 500 MG tablet, Take 1 tablet (500 mg total) by  mouth daily., Disp: 90 tablet, Rfl: 3   venlafaxine XR (EFFEXOR-XR) 75 MG 24 hr capsule, Take 75 mg by mouth daily., Disp: , Rfl:   No Known Allergies  Objective:   BP 122/82   Pulse 68   Temp 97.8 F (36.6 C) (Temporal)   Ht '5\' 1"'$  (1.549 m)   Wt 156 lb 6.4 oz (70.9 kg)   SpO2 99%   BMI 29.55 kg/m    Physical Exam Constitutional:      General: She is not in acute distress.    Appearance: Normal appearance. She is normal weight. She is not ill-appearing, toxic-appearing or diaphoretic.  HENT:     Head: Normocephalic.     Right Ear: Tympanic membrane normal.     Left Ear: Tympanic membrane normal.     Nose: Nose normal.     Mouth/Throat:     Mouth: Mucous membranes are dry.     Pharynx: No oropharyngeal exudate or posterior oropharyngeal erythema.  Eyes:     Extraocular Movements: Extraocular movements intact.     Pupils: Pupils are equal, round, and reactive to light.  Cardiovascular:     Rate and Rhythm: Normal rate and regular rhythm.     Pulses: Normal pulses.     Heart sounds: Normal heart sounds.  Pulmonary:  Effort: Pulmonary effort is normal.     Breath sounds: Normal breath sounds.  Musculoskeletal:     Cervical back: Normal range of motion.  Neurological:     General: No focal deficit present.     Mental Status: She is alert and oriented to person, place, and time. Mental status is at baseline.  Psychiatric:        Mood and Affect: Mood normal.        Behavior: Behavior normal.        Thought Content: Thought content normal.        Judgment: Judgment normal.     Assessment & Plan:  Sore throat -     POCT rapid strep A  Exposure to the flu -     POCT Influenza A/B  Contact with and (suspected) exposure to covid-19 -     POC COVID-19 BinaxNow  Strep pharyngitis Assessment & Plan: Strep tested positive in office.  rx for amox 500 mg po bid x 10 days.  Ibuprofen/tyelnol prn sore throat/fever Pt told to F/u if no improvement in the next 2-3  days.   Orders: -     Amoxicillin; Take 1 tablet (875 mg total) by mouth 2 (two) times daily for 10 days.  Dispense: 20 tablet; Refill: 0     Follow up plan: Return in about 3 months (around 06/13/2022) for f/u CPE.  Chelsea Pancoast, FNP

## 2022-03-13 NOTE — Patient Instructions (Signed)
------------------------------------    You were found to be strep positive,  Take antibiotics that have been sent to the pharmacy.  Change your toothbrush after 24 hours on the antibiotics.  Gargle with warm salt water as needed for sore throat.   ------------------------------------

## 2022-03-13 NOTE — Assessment & Plan Note (Signed)
Strep tested positive in office.  rx for amox 500 mg po bid x 10 days.  Ibuprofen/tyelnol prn sore throat/fever Pt told to F/u if no improvement in the next 2-3 days.

## 2022-04-10 ENCOUNTER — Telehealth: Payer: Self-pay | Admitting: Family

## 2022-04-10 ENCOUNTER — Encounter: Payer: Self-pay | Admitting: Family

## 2022-04-10 DIAGNOSIS — B009 Herpesviral infection, unspecified: Secondary | ICD-10-CM

## 2022-04-10 NOTE — Telephone Encounter (Signed)
Patient needs a refill on thyroid medication that is not on her medication list. She said that is starts with a "L".She did not know the name of it.

## 2022-04-10 NOTE — Telephone Encounter (Signed)
See other phone message  

## 2022-04-10 NOTE — Telephone Encounter (Signed)
See Mychart message. Sent rx request to provider.

## 2022-04-10 NOTE — Telephone Encounter (Signed)
Prescription Request  04/10/2022  LOV: 03/13/2022  What is the name of the medication or equipment?  valACYclovir (VALTREX) 500 MG tablet   Have you contacted your pharmacy to request a refill? Yes   Which pharmacy would you like this sent to?  [id number] [place] [address] [place] [address] Phone: [phone] Fax: [phone]     Patient notified that their request is being sent to the clinical staff for review and that they should receive a response within 2 business days.   Please advise at [place]

## 2022-04-10 NOTE — Telephone Encounter (Signed)
Needs refill on levothyroxine 50 MCG tablet ([facility])  and valACYclovir (VALTREX) 500 MG tablet   LR- 04/08/21 (90 tbs/ 3 refills- Valtrex) NV- 06/13/22 LV- 10/15/21

## 2022-04-14 MED ORDER — VALACYCLOVIR HCL 500 MG PO TABS: 500.0000 mg | ORAL_TABLET | Freq: Every day | ORAL | 3 refills | Status: DC

## 2022-04-20 ENCOUNTER — Encounter: Payer: Self-pay | Admitting: Family

## 2022-04-20 DIAGNOSIS — E039 Hypothyroidism, unspecified: Secondary | ICD-10-CM

## 2022-05-05 NOTE — Telephone Encounter (Signed)
Pt request levothyroxine 50 mcg refill. Med shows discontinued on 04/09/22. LV- 10/15/21 NV- 06/13/22

## 2022-05-06 MED ORDER — LEVOTHYROXINE SODIUM 50 MCG PO TABS: 50.0000 ug | ORAL_TABLET | Freq: Every day | ORAL | 0 refills | Status: DC

## 2022-05-11 ENCOUNTER — Encounter: Payer: Self-pay | Admitting: Family

## 2022-06-11 ENCOUNTER — Encounter: Payer: Self-pay | Admitting: Family

## 2022-06-13 ENCOUNTER — Encounter: Payer: Managed Care, Other (non HMO) | Admitting: Family

## 2022-07-01 ENCOUNTER — Encounter: Payer: Managed Care, Other (non HMO) | Admitting: Family

## 2022-07-29 ENCOUNTER — Other Ambulatory Visit: Payer: Self-pay | Admitting: Family

## 2022-07-29 DIAGNOSIS — E039 Hypothyroidism, unspecified: Secondary | ICD-10-CM

## 2022-08-01 ENCOUNTER — Encounter: Payer: Managed Care, Other (non HMO) | Admitting: Family

## 2022-08-11 IMAGING — MR MR MRA HEAD W/O CM
1 series · 23 of 48 positions shown · non-contrast
Comparison: CT head and orbits 07/04/2008.

CLINICAL DATA: 34-year-old female with family history of
intracranial aneurysm.

EXAM:
MRA HEAD WITHOUT CONTRAST
TECHNIQUE: Angiographic images of the Circle of Willis were obtained using MRA
technique without intravenous contrast.

[Series 2: tof_3d_multi-slab new · axial · 0.7mm · 0.35mm/px · z∈[-14,+64]mm · 23 of 117 slices shown]
[im 1/117]
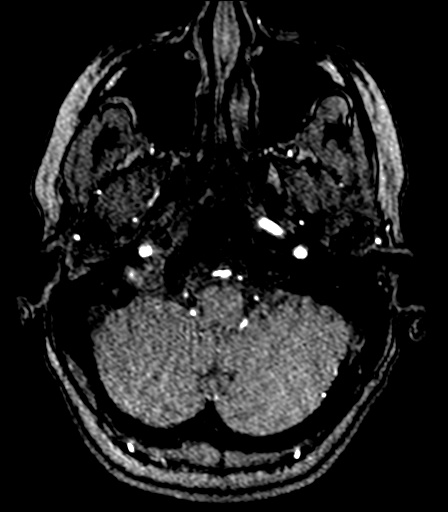
[im 3/117]
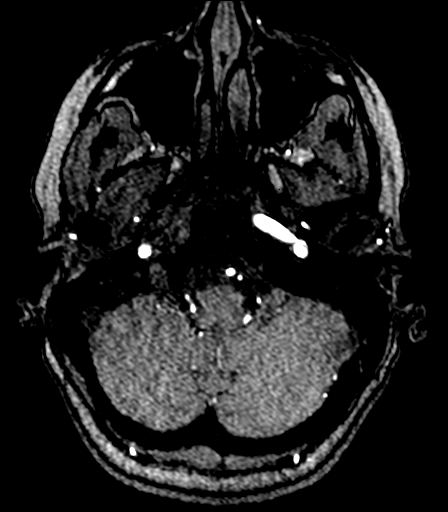
[im 5/117]
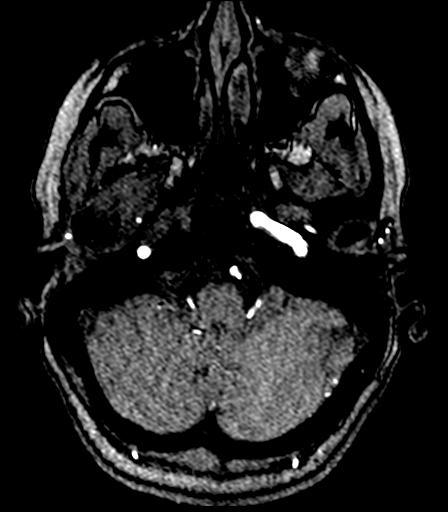
[im 8/117]
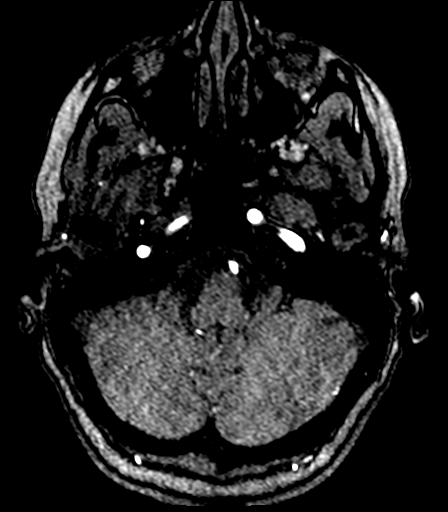
[im 10/117]
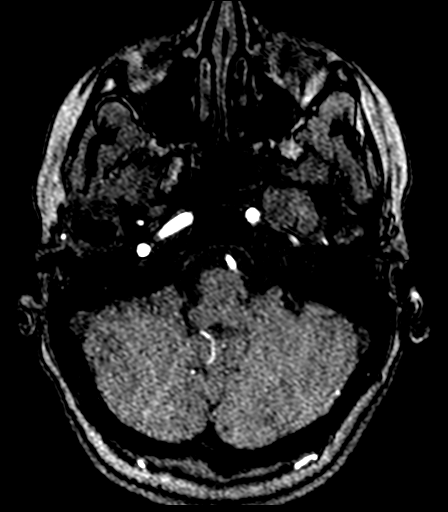
[im 13/117]
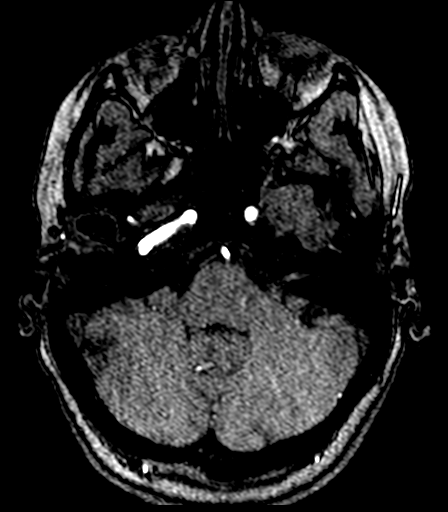
[im 15/117]
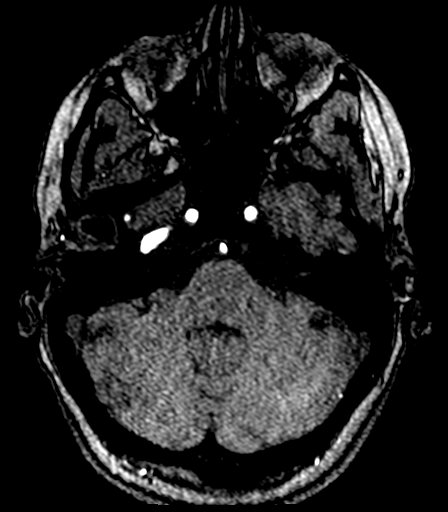
[im 18/117]
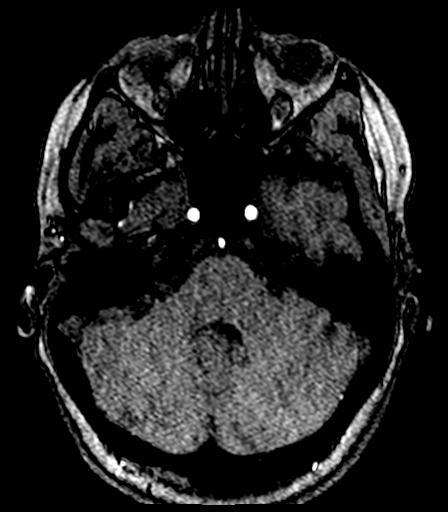
[im 20/117]
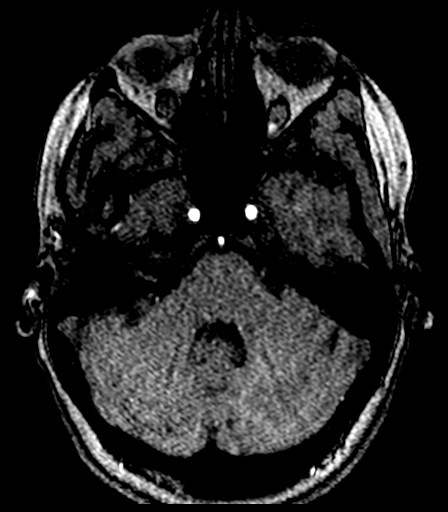
[im 23/117]
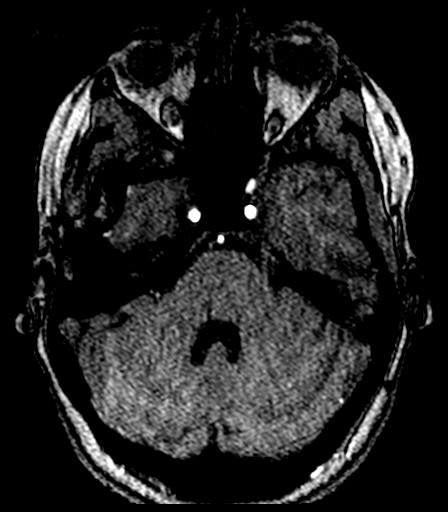
[im 25/117]
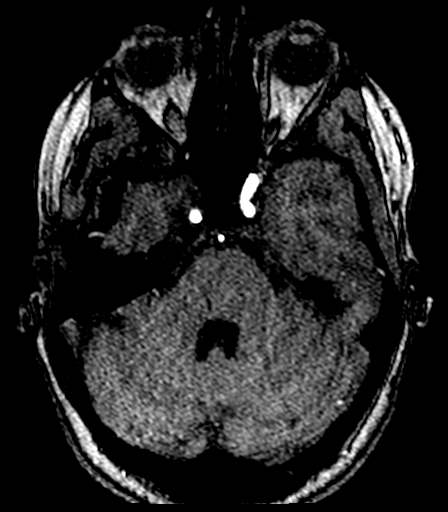
[im 28/117]
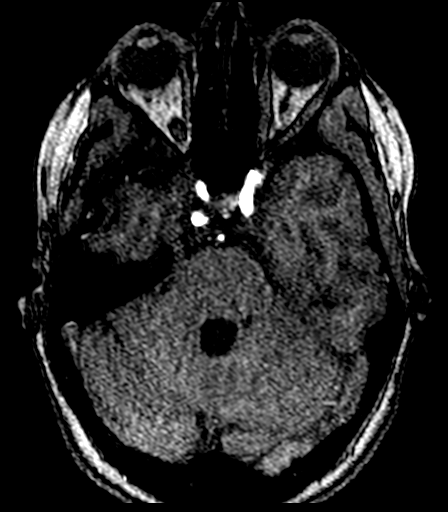
[im 30/117]
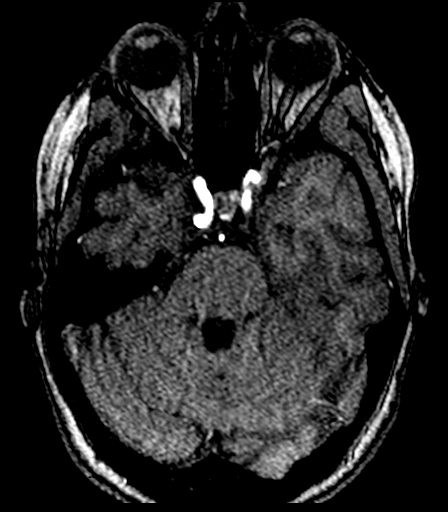
[im 33/117]
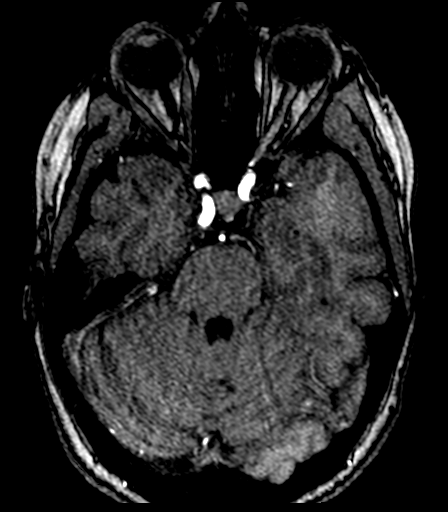
[im 35/117]
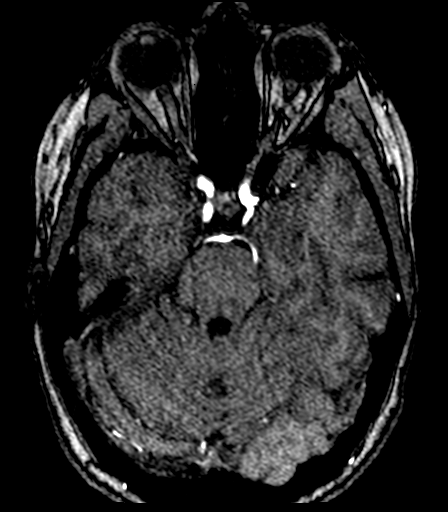
[im 38/117]
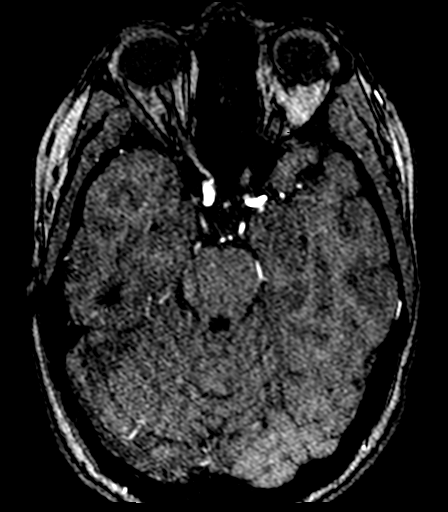
[im 52/117]
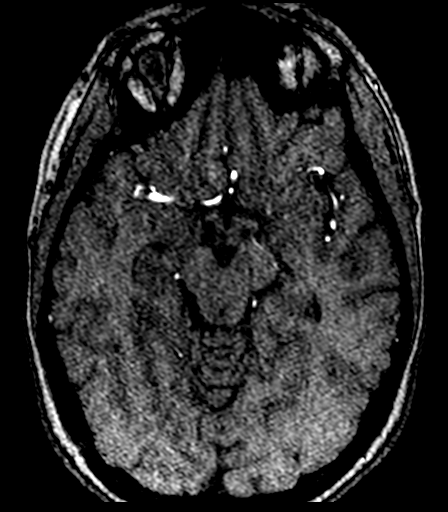
[im 60/117]
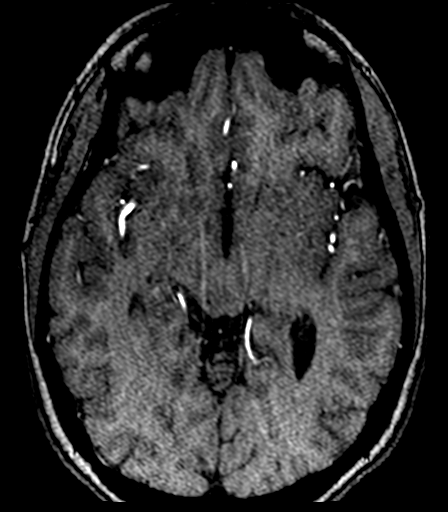
[im 67/117]
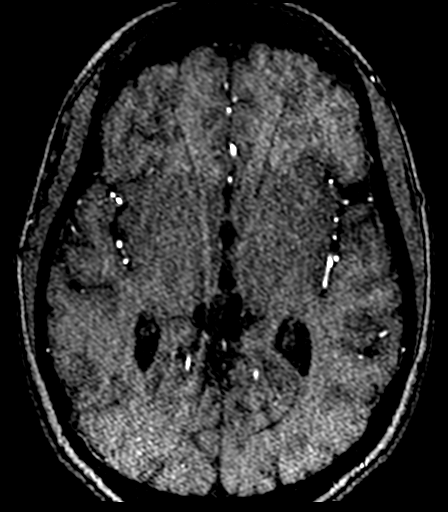
[im 82/117]
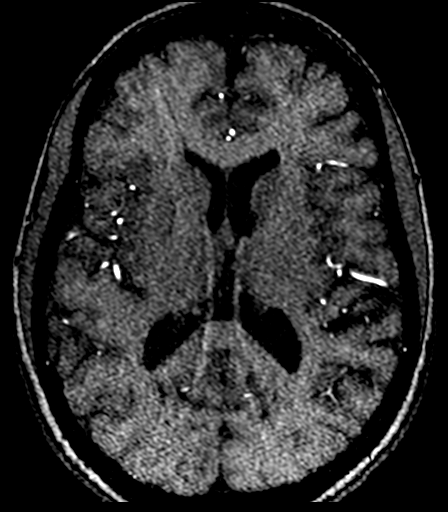
[im 97/117]
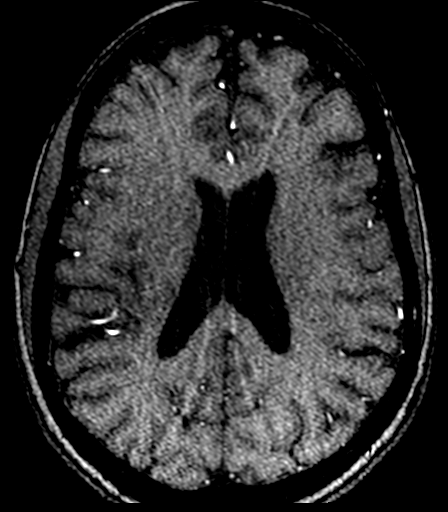
[im 99/117]
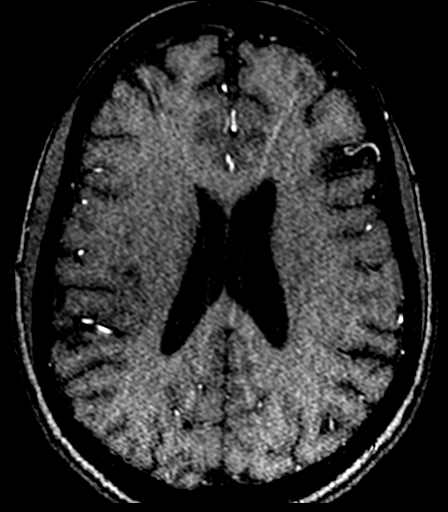
[im 112/117]
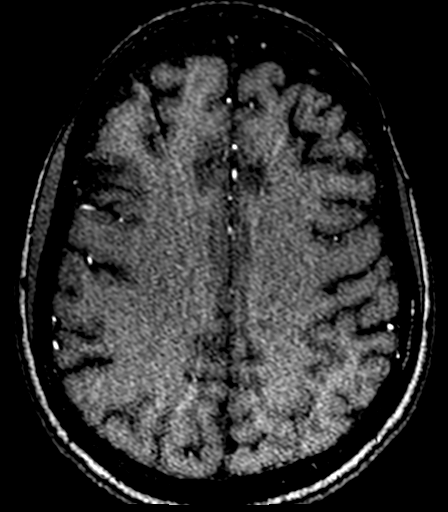

[23 of 48 positions shown; findings below may reference images not displayed]

FINDINGS: Cerebral volume does not appear significantly changed since 2242.
Stable mild asymmetry of the lateral ventricles, normal variant. No
intracranial mass effect or ventriculomegaly.

Antegrade flow in the posterior circulation. Distal vertebral
arteries are mostly excluded although the vertebrobasilar junction
is included and appears normal. Evidence of dominant right vertebral
artery. Patent basilar artery without stenosis. Normal SCA origins.
Fetal type bilateral PCA origins, normal variant. Normal posterior
communicating arteries. Normal bilateral PCA branches.

Antegrade flow in both ICA siphons. No siphon stenosis. Normal
ophthalmic and posterior communicating artery origins. Patent
carotid termini. Normal MCA and ACA origins. Dominant left ACA A1
segment, normal variant. Anterior communicating artery and visible
ACA branches are within normal limits. Bilateral MCA M1 segments,
MCA bifurcations, and visible MCA branches are within normal limits.
IMPRESSION: Negative intracranial MRA.  No evidence of intracranial aneurysm.

## 2022-08-13 ENCOUNTER — Encounter: Payer: Managed Care, Other (non HMO) | Admitting: Family

## 2022-08-14 ENCOUNTER — Encounter: Payer: Self-pay | Admitting: Family

## 2022-09-17 ENCOUNTER — Telehealth: Payer: Self-pay | Admitting: Family

## 2022-09-17 NOTE — Telephone Encounter (Signed)
Patient asked to leave a message regarding reflux, states she is having bad reflux and wants to know if there is anything that can help with this? Patient would like to restart her omeprazole as well.   Prescription Request  09/17/2022  LOV: 03/13/2022  What is the name of the medication or equipment?  omeprazole (PRILOSEC) 40 MG capsule  valACYclovir (VALTREX) 500 MG tablet   Have you contacted your pharmacy to request a refill? No   Which pharmacy would you like this sent to?  [id number] [place] [address] [place] [address] Phone: [phone] Fax: [phone]    Patient notified that their request is being sent to the clinical staff for review and that they should receive a response within 2 business days.   Please advise at [place] [phone] (mobile)

## 2022-09-18 ENCOUNTER — Other Ambulatory Visit: Payer: Self-pay | Admitting: Family

## 2022-09-18 DIAGNOSIS — E039 Hypothyroidism, unspecified: Secondary | ICD-10-CM

## 2022-09-18 NOTE — Telephone Encounter (Signed)
Make appt new complaint

## 2022-09-22 ENCOUNTER — Telehealth (INDEPENDENT_AMBULATORY_CARE_PROVIDER_SITE_OTHER): Payer: Managed Care, Other (non HMO) | Admitting: Family

## 2022-09-22 ENCOUNTER — Encounter: Payer: Self-pay | Admitting: Family

## 2022-09-22 DIAGNOSIS — E039 Hypothyroidism, unspecified: Secondary | ICD-10-CM

## 2022-09-22 DIAGNOSIS — K219 Gastro-esophageal reflux disease without esophagitis: Secondary | ICD-10-CM | POA: Diagnosis not present

## 2022-09-22 MED ORDER — PANTOPRAZOLE SODIUM 20 MG PO TBEC: 20.0000 mg | DELAYED_RELEASE_TABLET | Freq: Every day | ORAL | 1 refills | Status: DC

## 2022-09-22 MED ORDER — OMEPRAZOLE 40 MG PO CPDR: 40.0000 mg | DELAYED_RELEASE_CAPSULE | Freq: Every day | ORAL | 1 refills | Status: DC

## 2022-09-22 MED ORDER — LEVOTHYROXINE SODIUM 50 MCG PO TABS: 50.0000 ug | ORAL_TABLET | Freq: Every day | ORAL | 0 refills | Status: DC

## 2022-09-22 NOTE — Progress Notes (Signed)
Virtual Visit via Video note  I connected with Chelsea Jackson on 09/22/22 at home by video and verified that I am speaking with the correct person using two identifiers.The provider, Mort Sawyers, FNP is located in their office at time of visit.  I discussed the limitations, risks, security and privacy concerns of performing an evaluation and management service by video and the availability of in person appointments. I also discussed with the patient that there may be a patient responsible charge related to this service. The patient expressed understanding and agreed to proceed.  Subjective: PCP: Mort Sawyers, FNP  Chief Complaint  Patient presents with   Heartburn    Heartburn She complains of heartburn.    GERD: had really bad acid reflux last week with days of heartburn with nausea in her throat it was miserable and hard to function. Taking over the counter  She thinks that stress really flared her and she does say that she has not been eating the best with increased stress and grieving. She is taking omeprazole 40 mg once daily. EGD , normal findings on 2018   ROS: Per HPI  Current Outpatient Medications:    ALAYCEN 1/35 tablet, TAKE 1 TABLET BY MOUTH EVERY DAY, Disp: 84 tablet, Rfl: 1   amphetamine-dextroamphetamine (ADDERALL XR) 30 MG 24 hr capsule, Take 30 mg by mouth in the morning and at bedtime., Disp: , Rfl:    Biotin 1 MG CAPS, Take by mouth., Disp: , Rfl:    Cholecalciferol (VITAMIN D3) 1000 units CAPS, Take by mouth., Disp: , Rfl:    clonazePAM (KLONOPIN) 1 MG tablet, Take 1 mg by mouth at bedtime as needed., Disp: , Rfl:    cyanocobalamin (VITAMIN B12) 1000 MCG tablet, Take 1,000 mcg by mouth daily., Disp: , Rfl:    lamoTRIgine (LAMICTAL) 25 MG tablet, Take 50 mg by mouth daily., Disp: , Rfl:    Multiple Vitamins-Minerals (MULTIVITAMIN ADULT PO), Take by mouth daily., Disp: , Rfl:    omeprazole (PRILOSEC) 40 MG capsule, Take 1 capsule (40 mg total) by mouth  daily., Disp: 30 capsule, Rfl: 1   valACYclovir (VALTREX) 500 MG tablet, Take 1 tablet (500 mg total) by mouth daily., Disp: 90 tablet, Rfl: 3   venlafaxine XR (EFFEXOR-XR) 75 MG 24 hr capsule, Take 75 mg by mouth daily., Disp: , Rfl:    levothyroxine (SYNTHROID) 50 MCG tablet, Take 1 tablet (50 mcg total) by mouth daily., Disp: 90 tablet, Rfl: 0  Observations/Objective: Physical Exam Constitutional:      General: She is not in acute distress.    Appearance: Normal appearance. She is not ill-appearing.  Pulmonary:     Effort: Pulmonary effort is normal.  Neurological:     General: No focal deficit present.     Mental Status: She is alert and oriented to person, place, and time.  Psychiatric:        Mood and Affect: Mood normal.        Behavior: Behavior normal.        Thought Content: Thought content normal.     Assessment and Plan: Gastroesophageal reflux disease without esophagitis Assessment & Plan: Rx omeprazole 40 mg  Try to decrease and or avoid spicy foods, fried fatty foods, and also caffeine and chocolate as these can increase heartburn symptoms.  Advised pt would have preferred in office visit for this assessment to evaluate appropriately. Did advise pt if symptoms do not improve will need to be seen in office to evaluate  further.  Ddx cholecystitis   Orders: -     Omeprazole; Take 1 capsule (40 mg total) by mouth daily.  Dispense: 30 capsule; Refill: 1  Hypothyroidism, unspecified type Assessment & Plan: Will check TSH at physical  Continue levothyroxine 50 mcg once daily   Orders: -     Levothyroxine Sodium; Take 1 tablet (50 mcg total) by mouth daily.  Dispense: 90 tablet; Refill: 0    Follow Up Instructions: Return in about 1 month (around 10/22/2022) for as scheduled .   I discussed the assessment and treatment plan with the patient. The patient was provided an opportunity to ask questions and all were answered. The patient agreed with the plan and  demonstrated an understanding of the instructions.   The patient was advised to call back or seek an in-person evaluation if the symptoms worsen or if the condition fails to improve as anticipated.  The above assessment and management plan was discussed with the patient. The patient verbalized understanding of and has agreed to the management plan. Patient is aware to call the clinic if symptoms persist or worsen. Patient is aware when to return to the clinic for a follow-up visit. Patient educated on when it is appropriate to go to the emergency department.     Mort Sawyers, MSN, APRN, FNP-C [facility]

## 2022-09-22 NOTE — Assessment & Plan Note (Signed)
Rx omeprazole 40 mg  Try to decrease and or avoid spicy foods, fried fatty foods, and also caffeine and chocolate as these can increase heartburn symptoms.  Advised pt would have preferred in office visit for this assessment to evaluate appropriately. Did advise pt if symptoms do not improve will need to be seen in office to evaluate further.  Ddx cholecystitis

## 2022-09-22 NOTE — Assessment & Plan Note (Signed)
Will check TSH at physical  Continue levothyroxine 50 mcg once daily

## 2022-09-22 NOTE — Patient Instructions (Signed)
 ------------------------------------    Start pantoprazole 20 mg once daily, RX.  Take this medication for two weeks, administer 30 minutes prior to breakfast each am. Try to decrease and or avoid spicy foods, fried fatty foods, and also caffeine and chocolate as these can increase heartburn symptoms.   ------------------------------------

## 2022-10-15 ENCOUNTER — Other Ambulatory Visit: Payer: Self-pay | Admitting: Family

## 2022-10-15 DIAGNOSIS — K219 Gastro-esophageal reflux disease without esophagitis: Secondary | ICD-10-CM

## 2022-10-24 ENCOUNTER — Encounter: Payer: Self-pay | Admitting: Family

## 2022-10-24 ENCOUNTER — Ambulatory Visit (INDEPENDENT_AMBULATORY_CARE_PROVIDER_SITE_OTHER): Payer: Managed Care, Other (non HMO) | Admitting: Family

## 2022-10-24 VITALS — BP 120/86 | HR 67 | Temp 97.7°F | Ht 61.0 in | Wt 155.4 lb

## 2022-10-24 DIAGNOSIS — B009 Herpesviral infection, unspecified: Secondary | ICD-10-CM

## 2022-10-24 DIAGNOSIS — Z1159 Encounter for screening for other viral diseases: Secondary | ICD-10-CM

## 2022-10-24 DIAGNOSIS — Z13220 Encounter for screening for lipoid disorders: Secondary | ICD-10-CM

## 2022-10-24 DIAGNOSIS — F902 Attention-deficit hyperactivity disorder, combined type: Secondary | ICD-10-CM

## 2022-10-24 DIAGNOSIS — K219 Gastro-esophageal reflux disease without esophagitis: Secondary | ICD-10-CM

## 2022-10-24 DIAGNOSIS — Z202 Contact with and (suspected) exposure to infections with a predominantly sexual mode of transmission: Secondary | ICD-10-CM

## 2022-10-24 DIAGNOSIS — F411 Generalized anxiety disorder: Secondary | ICD-10-CM

## 2022-10-24 DIAGNOSIS — Z0000 Encounter for general adult medical examination without abnormal findings: Secondary | ICD-10-CM

## 2022-10-24 DIAGNOSIS — E039 Hypothyroidism, unspecified: Secondary | ICD-10-CM

## 2022-10-24 DIAGNOSIS — Z23 Encounter for immunization: Secondary | ICD-10-CM | POA: Diagnosis not present

## 2022-10-24 DIAGNOSIS — F6381 Intermittent explosive disorder: Secondary | ICD-10-CM

## 2022-10-24 DIAGNOSIS — N898 Other specified noninflammatory disorders of vagina: Secondary | ICD-10-CM | POA: Insufficient documentation

## 2022-10-24 DIAGNOSIS — Z114 Encounter for screening for human immunodeficiency virus [HIV]: Secondary | ICD-10-CM

## 2022-10-24 DIAGNOSIS — Z1283 Encounter for screening for malignant neoplasm of skin: Secondary | ICD-10-CM

## 2022-10-24 LAB — BASIC METABOLIC PANEL
BUN: 16 mg/dL (ref 6–23)
CO2: 29 meq/L (ref 19–32)
Calcium: 10.1 mg/dL (ref 8.4–10.5)
Chloride: 100 meq/L (ref 96–112)
Creatinine, Ser: 0.67 mg/dL (ref 0.40–1.20)
GFR: 110.26 mL/min (ref >60.00)
Glucose, Bld: 96 mg/dL (ref 70–99)
Potassium: 4.6 meq/L (ref 3.5–5.1)
Sodium: 137 meq/L (ref 135–145)

## 2022-10-24 LAB — TSH: TSH: 1.12 u[IU]/mL (ref 0.35–5.50)

## 2022-10-24 LAB — CBC
HCT: 46.8 % — ABNORMAL HIGH (ref 36.0–46.0)
Hemoglobin: 15.5 g/dL — ABNORMAL HIGH (ref 12.0–15.0)
MCHC: 33.1 g/dL (ref 30.0–36.0)
MCV: 95.4 fL (ref 78.0–100.0)
Platelets: 406 10*3/uL — ABNORMAL HIGH (ref 150.0–400.0)
RBC: 4.91 Mil/uL (ref 3.87–5.11)
RDW: 12.5 % (ref 11.5–15.5)
WBC: 8.1 10*3/uL (ref 4.0–10.5)

## 2022-10-24 LAB — LIPID PANEL
Cholesterol: 183 mg/dL (ref 0–200)
HDL: 57.3 mg/dL (ref >39.00)
LDL Cholesterol: 108 mg/dL — ABNORMAL HIGH (ref 0–99)
NonHDL: 125.85
Total CHOL/HDL Ratio: 3
Triglycerides: 89 mg/dL (ref 0.0–149.0)
VLDL: 17.8 mg/dL (ref 0.0–40.0)

## 2022-10-24 NOTE — Assessment & Plan Note (Signed)
Improving however can not seem to wean off of omeprazole without rebound Try to decrease and or avoid spicy foods, fried fatty foods, and also caffeine and chocolate as these can increase heartburn symptoms.

## 2022-10-24 NOTE — Assessment & Plan Note (Signed)
Continue valtrex 500 mg once daily  With flares increase to 1000 mg twice daily

## 2022-10-24 NOTE — Patient Instructions (Addendum)
  During flare ups increase your valtrex to 1000 mg twice daily (if you have the 500 mg tablets you will take two tablets twice a day for five days) Then go back to 500 mg once daily.   A referral was placed today for psychiatry.  Please let us know if you have not heard back within 2 weeks about the referral.  Call [facility] to get appointment as you should already be established.

## 2022-10-24 NOTE — Assessment & Plan Note (Addendum)
Not improving, increased stressors  Continue to go to therapy once weekly and or sometimes bimonthly Referral placed for second opinion for psychiatry

## 2022-10-24 NOTE — Assessment & Plan Note (Signed)
Doing well with 30 mg once daily in am and 15 mg in afternoon

## 2022-10-24 NOTE — Assessment & Plan Note (Signed)
Tsh ordered  Continue levothyroxine 50 mcg once daily

## 2022-10-24 NOTE — Progress Notes (Unsigned)
Subjective:  Patient ID: Chelsea Jackson, female    DOB: [birth year]  Age: 37 y.o. MRN: [id number]  Patient Care Team: Mort Sawyers, FNP as PCP - General (Family Medicine) Leone Payor, FNP (Psychiatry) Olivia Mackie, NP as Nurse Practitioner (Gynecology)   CC:  Chief Complaint  Patient presents with   Annual Exam    HPI Chelsea Jackson is a 37 y.o. female who presents today for an annual physical exam. She reports consuming a general diet. The patient does not participate in regular exercise at present. She generally feels well. She reports sleeping well. She does have additional problems to discuss today.   Vision:Not within last year Dental:No current dental problems   Pt is without acute concerns.   Acid reflux: still taking omeprazole 40 mg once daily. Doing well and states much improvement.   Obesity: has been stressful this year and she is having a poor diet. She knows what she needs to go but wants to start following a better diet and wants to get back to exercise  ADD, GAD: seeing psychiatry at [facility] but wants to try to switch to another psychiatrist as she does not feel anxiety is doing well and doesn't get good answers from her current one. She states she is in and out in five minutes, and doesn't seem to listen much. She is on lamictal 50 mg once daily, adderall 30 mg in AM and 15mg  in the afternoon.   05/06/20 pap, normal findings.   Does state that abn vaginal smell at times, using boric acid at times but doesn't always work.   Advanced Directives Patient does not have advanced directives   DEPRESSION SCREENING    10/24/2022    8:39 AM 04/08/2021    3:23 PM  PHQ 2/9 Scores  PHQ - 2 Score 0 0  PHQ- 9 Score 0 5     ROS: Negative unless specifically indicated above in HPI.    Current Outpatient Medications:    amphetamine-dextroamphetamine (ADDERALL) 15 MG tablet, Take 15 mg by mouth daily., Disp: , Rfl:    ALAYCEN 1/35 tablet, TAKE 1  TABLET BY MOUTH EVERY DAY, Disp: 84 tablet, Rfl: 1   amphetamine-dextroamphetamine (ADDERALL XR) 30 MG 24 hr capsule, Take 30 mg by mouth in the morning and at bedtime., Disp: , Rfl:    Biotin 1 MG CAPS, Take by mouth., Disp: , Rfl:    Cholecalciferol (VITAMIN D3) 1000 units CAPS, Take by mouth., Disp: , Rfl:    clonazePAM (KLONOPIN) 1 MG tablet, Take 1 mg by mouth at bedtime as needed., Disp: , Rfl:    cyanocobalamin (VITAMIN B12) 1000 MCG tablet, Take 1,000 mcg by mouth daily., Disp: , Rfl:    lamoTRIgine (LAMICTAL) 25 MG tablet, Take 50 mg by mouth daily., Disp: , Rfl:    levothyroxine (SYNTHROID) 50 MCG tablet, Take 1 tablet (50 mcg total) by mouth daily., Disp: 90 tablet, Rfl: 0   Multiple Vitamins-Minerals (MULTIVITAMIN ADULT PO), Take by mouth daily., Disp: , Rfl:    omeprazole (PRILOSEC) 40 MG capsule, TAKE 1 CAPSULE (40 MG TOTAL) BY MOUTH DAILY., Disp: 90 capsule, Rfl: 1   valACYclovir (VALTREX) 500 MG tablet, Take 1 tablet (500 mg total) by mouth daily., Disp: 90 tablet, Rfl: 3   venlafaxine XR (EFFEXOR-XR) 75 MG 24 hr capsule, Take 75 mg by mouth daily., Disp: , Rfl:     Objective:    BP 120/86 (BP Location: Left Arm, Patient Position: Sitting, Cuff Size:  Normal)   Pulse 67   Temp 97.7 F (36.5 C) (Oral)   Ht 5\' 1"  (1.549 m)   Wt 155 lb 6.4 oz (70.5 kg)   SpO2 98%   BMI 29.36 kg/m   BP Readings from Last 3 Encounters:  10/24/22 120/86  03/13/22 122/82  12/19/21 112/70      Physical Exam Constitutional:      General: She is not in acute distress.    Appearance: Normal appearance. She is obese. She is not ill-appearing.  HENT:     Head: Normocephalic.     Right Ear: Tympanic membrane normal.     Left Ear: Tympanic membrane normal.     Nose: Nose normal.     Mouth/Throat:     Mouth: Mucous membranes are moist.  Eyes:     Extraocular Movements: Extraocular movements intact.     Pupils: Pupils are equal, round, and reactive to light.  Cardiovascular:     Rate and  Rhythm: Normal rate and regular rhythm.  Pulmonary:     Effort: Pulmonary effort is normal.     Breath sounds: Normal breath sounds.  Abdominal:     General: Abdomen is flat. Bowel sounds are normal.     Palpations: Abdomen is soft.     Tenderness: There is no guarding or rebound.  Musculoskeletal:        General: Normal range of motion.     Cervical back: Normal range of motion.  Skin:    General: Skin is warm.     Capillary Refill: Capillary refill takes less than 2 seconds.  Neurological:     General: No focal deficit present.     Mental Status: She is alert.  Psychiatric:        Mood and Affect: Mood normal.        Behavior: Behavior normal.        Thought Content: Thought content normal.        Judgment: Judgment normal.            Assessment & Plan:  Encounter for immunization -     Flu vaccine trivalent PF, 6mos and older(Flulaval,Afluria,Fluarix,Fluzone)  Acquired hypothyroidism Assessment & Plan: Tsh ordered  Continue levothyroxine 50 mcg once daily  Orders: -     TSH  Screening for lipoid disorders -     Lipid panel  Encounter for general adult medical examination without abnormal findings -     TSH -     Lipid panel -     Basic metabolic panel -     CBC  Attention deficit hyperactivity disorder (ADHD), combined type Assessment & Plan: Doing well with 30 mg once daily in am and 15 mg in afternoon   Orders: -     Ambulatory referral to Psychiatry  GAD (generalized anxiety disorder) Assessment & Plan: Not improving, increased stressors  Continue to go to therapy once weekly and or sometimes bimonthly Referral placed for second opinion for psychiatry   Orders: -     Ambulatory referral to Psychiatry  Intermittent explosive disorder -     Ambulatory referral to Psychiatry  Gastroesophageal reflux disease, unspecified whether esophagitis present Assessment & Plan: Improving however can not seem to wean off of omeprazole without  rebound Try to decrease and or avoid spicy foods, fried fatty foods, and also caffeine and chocolate as these can increase heartburn symptoms.     Herpes simplex type 2 infection Assessment & Plan: Continue valtrex 500 mg once daily  With  flares increase to 1000 mg twice daily   Vaginal odor -     WET PREP BY MOLECULAR PROBE -     RPR -     HIV Antibody (routine testing w rflx) -     Hepatitis C antibody -     Chlamydia/Gonococcus/Trichomonas, NAA  Screening for skin cancer -     Ambulatory referral to Dermatology  Screening for HIV (human immunodeficiency virus) -     HIV Antibody (routine testing w rflx)  Encounter for hepatitis C screening test for low risk patient -     Hepatitis C antibody  Encounter for assessment of STD exposure -     RPR -     HIV Antibody (routine testing w rflx) -     Hepatitis C antibody -     Chlamydia/Gonococcus/Trichomonas, NAA      Follow-up: Return in about 1 year (around 10/24/2023) for f/u CPE.   Mort Sawyers, FNP

## 2022-10-26 ENCOUNTER — Encounter: Payer: Self-pay | Admitting: Family

## 2022-10-27 ENCOUNTER — Encounter: Payer: Self-pay | Admitting: Family

## 2022-10-27 NOTE — Assessment & Plan Note (Signed)
Wet prep ordered pending results.  Suspect either BV or yeast.  Patient declines STD testing at this time.

## 2022-10-28 ENCOUNTER — Other Ambulatory Visit: Payer: Self-pay | Admitting: Family

## 2022-10-28 DIAGNOSIS — N760 Acute vaginitis: Secondary | ICD-10-CM

## 2022-10-28 LAB — WET PREP BY MOLECULAR PROBE
Candida species: NOT DETECTED
MICRO NUMBER:: 15613837
SPECIMEN QUALITY:: ADEQUATE
Trichomonas vaginosis: NOT DETECTED

## 2022-10-28 LAB — HIV ANTIBODY (ROUTINE TESTING W REFLEX): HIV 1&2 Ab, 4th Generation: NONREACTIVE

## 2022-10-28 LAB — CHLAMYDIA/GONOCOCCUS/TRICHOMONAS, NAA
Chlamydia by NAA: NEGATIVE
Gonococcus by NAA: NEGATIVE
Trich vag by NAA: NEGATIVE

## 2022-10-28 LAB — HEPATITIS C ANTIBODY: Hepatitis C Ab: NONREACTIVE

## 2022-10-28 LAB — RPR: RPR Ser Ql: NONREACTIVE

## 2022-10-28 MED ORDER — METRONIDAZOLE 500 MG PO TABS
500.0000 mg | ORAL_TABLET | Freq: Two times a day (BID) | ORAL | 0 refills | Status: AC
Start: 2022-10-28 — End: 2022-11-04

## 2022-10-29 ENCOUNTER — Encounter: Payer: Self-pay | Admitting: *Deleted

## 2022-12-12 ENCOUNTER — Encounter: Payer: Self-pay | Admitting: Nurse Practitioner

## 2022-12-12 ENCOUNTER — Telehealth: Payer: Self-pay

## 2022-12-12 ENCOUNTER — Telehealth: Payer: Managed Care, Other (non HMO) | Admitting: Nurse Practitioner

## 2022-12-12 DIAGNOSIS — T1592XA Foreign body on external eye, part unspecified, left eye, initial encounter: Secondary | ICD-10-CM | POA: Insufficient documentation

## 2022-12-12 NOTE — Progress Notes (Signed)
Ph: [phone] Fax: [phone]   Patient ID: [name], female    DOB: [birth year], 37 y.o.   MRN: [id number]  Virtual visit completed through MyChart, a video enabled telemedicine application. Due to national recommendations of social distancing due to COVID-19, a virtual visit is felt to be most appropriate for this patient at this time. Reviewed limitations, risks, security and privacy concerns of performing a virtual visit and the availability of in person appointments. I also reviewed that there may be a patient responsible charge related to this service. The patient agreed to proceed.   Patient location: home Provider location: [place] at [facility], office Persons participating in this virtual visit: patient, provider   If any vitals were documented, they were collected by patient at home unless specified below.    There were no vitals taken for this visit.   CC: Foreign body in eye Subjective:   HPI: Chelsea Jackson is a 37 y.o. female presenting on 12/12/2022 for Eye Problem (Pt complains of a particle from fake christmas tree/ fake snow gets in left eye yesterday. Pt states later on eye was red, crusty, and sore. Not itchy. )   States that yesterday she was putting up her tree and part of the tess got into her eye. She did not think anything of it. States that it is swollen and sore. States that last night she tried some visine.  States it is red and sore but not itchy.  Patient did not flush her eye out and is unsure if the object is still in her eye     Relevant past medical, surgical, family and social history reviewed and updated as indicated. Interim medical history since our last visit reviewed. Allergies and medications reviewed and updated. Outpatient Medications Prior to Visit  Medication Sig Dispense Refill   ALAYCEN 1/35 tablet TAKE 1 TABLET BY MOUTH EVERY DAY 84 tablet 1   amphetamine-dextroamphetamine (ADDERALL XR) 30 MG 24 hr capsule Take 30 mg by  mouth in the morning and at bedtime.     amphetamine-dextroamphetamine (ADDERALL) 15 MG tablet Take 15 mg by mouth daily.     Biotin 1 MG CAPS Take by mouth.     Cholecalciferol (VITAMIN D3) 1000 units CAPS Take by mouth.     clonazePAM (KLONOPIN) 1 MG tablet Take 1 mg by mouth at bedtime as needed.     cyanocobalamin (VITAMIN B12) 1000 MCG tablet Take 1,000 mcg by mouth daily.     lamoTRIgine (LAMICTAL) 100 MG tablet SMARTSIG:1.0 Tablet(s) By Mouth Daily     lamoTRIgine (LAMICTAL) 25 MG tablet Take 50 mg by mouth daily.     levothyroxine (SYNTHROID) 50 MCG tablet Take 1 tablet (50 mcg total) by mouth daily. 90 tablet 0   Multiple Vitamins-Minerals (MULTIVITAMIN ADULT PO) Take by mouth daily.     omeprazole (PRILOSEC) 40 MG capsule TAKE 1 CAPSULE (40 MG TOTAL) BY MOUTH DAILY. 90 capsule 1   valACYclovir (VALTREX) 500 MG tablet Take 1 tablet (500 mg total) by mouth daily. 90 tablet 3   venlafaxine XR (EFFEXOR-XR) 75 MG 24 hr capsule Take 150 mg by mouth daily.     No facility-administered medications prior to visit.     Per HPI unless specifically indicated in ROS section below Review of Systems Objective:  There were no vitals taken for this visit.  Wt Readings from Last 3 Encounters:  10/24/22 155 lb 6.4 oz (70.5 kg)  03/13/22 156 lb 6.4 oz (70.9 kg)  12/19/21 156 lb 8 oz (71 kg)       Physical exam: Gen: alert, NAD, not ill appearing Pulm: speaks in complete sentences without increased work of breathing Psych: normal mood, normal thought content      Results for orders placed or performed in visit on 10/24/22  WET PREP BY MOLECULAR PROBE   Specimen: Blood  Result Value Ref Range   MICRO NUMBER: [id number]    SPECIMEN QUALITY: Adequate    SOURCE: NOT GIVEN    STATUS: FINAL    Trichomonas vaginosis Not Detected    Gardnerella vaginalis (A)     Detected. Increased levels of G. vaginalis may not be significant in the absence of signs and symptoms of bacterial vaginosis.    Candida species Not Detected   Chlamydia/Gonococcus/Trichomonas, NAA   Specimen: Blood   BLD  Result Value Ref Range   Chlamydia by NAA Negative Negative   Gonococcus by NAA Negative Negative   Trich vag by NAA Negative Negative  TSH  Result Value Ref Range   TSH 1.12 0.35 - 5.50 uIU/mL  Lipid panel  Result Value Ref Range   Cholesterol 183 0 - 200 mg/dL   Triglycerides 74.2 0.0 - 149.0 mg/dL   HDL 59.56 >38.75 mg/dL   VLDL 64.3 0.0 - 32.9 mg/dL   LDL Cholesterol 518 (H) 0 - 99 mg/dL   Total CHOL/HDL Ratio 3    NonHDL 125.85   Basic metabolic panel  Result Value Ref Range   Sodium 137 135 - 145 mEq/L   Potassium 4.6 3.5 - 5.1 mEq/L   Chloride 100 96 - 112 mEq/L   CO2 29 19 - 32 mEq/L   Glucose, Bld 96 70 - 99 mg/dL   BUN 16 6 - 23 mg/dL   Creatinine, Ser 8.41 0.40 - 1.20 mg/dL   GFR 660.63 >01.60 mL/min   Calcium 10.1 8.4 - 10.5 mg/dL  CBC  Result Value Ref Range   WBC 8.1 4.0 - 10.5 K/uL   RBC 4.91 3.87 - 5.11 Mil/uL   Platelets 406.0 (H) 150.0 - 400.0 K/uL   Hemoglobin 15.5 (H) 12.0 - 15.0 g/dL   HCT 10.9 (H) 32.3 - 55.7 %   MCV 95.4 78.0 - 100.0 fl   MCHC 33.1 30.0 - 36.0 g/dL   RDW 32.2 02.5 - 42.7 %  RPR  Result Value Ref Range   RPR Ser Ql NON-REACTIVE NON-REACTIVE  HIV Antibody (routine testing w rflx)  Result Value Ref Range   HIV 1&2 Ab, 4th Generation NON-REACTIVE NON-REACTIVE  Hepatitis C antibody  Result Value Ref Range   Hepatitis C Ab NON-REACTIVE NON-REACTIVE   Assessment & Plan:   Foreign body of left eye, initial encounter Assessment & Plan: Given the possibility of retained foreign body not appropriate for virtual visit.  Will have patient seen in person by colleague urgent care.  No charge for visit today      I discussed the assessment and treatment plan with the patient. The patient was provided an opportunity to ask questions and all were answered. The patient agreed with the plan and demonstrated an understanding of the instructions.  The patient was advised to call back or seek an in-person evaluation if the symptoms worsen or if the condition fails to improve as anticipated.  Follow up plan: Return be seen in person.  Audria Nine, NP

## 2022-12-12 NOTE — Telephone Encounter (Signed)
Phone call to pt at the request of Audria Nine, NP.  Pt was scheduled a virtual visit for eye irritation and crust. Once Susy Frizzle was in the visit, pt needed an in person visit to evaluate if there was a foreign body in her eye.  Pt reported that fake snow from a Christmas tree got in her eye.  No available in person visits at [facility] or [facility].  Asked pt to go to [facility] for evaluation.  Pt reported that she did not want to wait.  Appointment made for tomorrow 12/13/22.  Encouraged pt to use cool compress to help with swelling.  If pain or drainage got worse to go to UC today.  Pt verbalized understanding and thanks for our care.

## 2022-12-12 NOTE — Assessment & Plan Note (Signed)
Given the possibility of retained foreign body not appropriate for virtual visit.  Will have patient seen in person by colleague urgent care.  No charge for visit today

## 2022-12-13 ENCOUNTER — Ambulatory Visit
Admission: RE | Admit: 2022-12-13 | Discharge: 2022-12-13 | Disposition: A | Discharge status: Home / Self Care | Payer: Self-pay | Source: Ambulatory Visit | Attending: Emergency Medicine | Admitting: Emergency Medicine

## 2022-12-13 VITALS — BP 145/90 | HR 72 | Temp 98.2°F | Resp 16

## 2022-12-13 DIAGNOSIS — H109 Unspecified conjunctivitis: Secondary | ICD-10-CM

## 2022-12-13 MED ORDER — MOXIFLOXACIN HCL 0.5 % OP SOLN: 1.0000 [drp] | Freq: Three times a day (TID) | OPHTHALMIC | 0 refills | Status: DC

## 2022-12-13 NOTE — ED Provider Notes (Signed)
Renaldo Fiddler    CSN: [id number] Arrival date & time: 12/13/22  1408      History   Chief Complaint Chief Complaint  Patient presents with   Eye Problem    Entered by patient    HPI Chelsea Jackson is a 37 y.o. female.   Patient presents for evaluation of left eye redness and drainage with crusting beginning 2 days ago.  Symptoms started after getting artificial Christmas note and to her eye.  Associated mild itching.  Denies pain, blurred vision or light sensitivity.  Denies injury or trauma.  Past Medical History:  Diagnosis Date   Anxiety    Bronchitis    GERD (gastroesophageal reflux disease)    Herpes    Thyroid disease     Patient Active Problem List   Diagnosis Date Noted   Foreign body of left eye 12/12/2022   GAD (generalized anxiety disorder) 10/15/2021   Intermittent explosive disorder 10/15/2021   Hypothyroidism 04/08/2021   Raynaud's disease 04/08/2021   Herpes simplex type 2 infection 04/08/2021   GERD (gastroesophageal reflux disease) 04/08/2021   Benign mole 04/08/2021   ADD (attention deficit disorder) 12/30/2017   Hoarseness 03/25/2016    History reviewed. No pertinent surgical history.  OB History     Gravida  0   Para  0   Term  0   Preterm  0   AB  0   Living  0      SAB  0   IAB  0   Ectopic  0   Multiple  0   Live Births  0            Home Medications    Prior to Admission medications   Medication Sig Start Date End Date Taking? Authorizing Provider  moxifloxacin (VIGAMOX) 0.5 % ophthalmic solution Place 1 drop into the left eye 3 (three) times daily. 12/13/22  Yes Valinda Hoar, NP  ALAYCEN 1/35 tablet TAKE 1 TABLET BY MOUTH EVERY DAY 07/29/22   Mort Sawyers, FNP  amphetamine-dextroamphetamine (ADDERALL XR) 30 MG 24 hr capsule Take 30 mg by mouth in the morning and at bedtime.    [provider]  amphetamine-dextroamphetamine (ADDERALL) 15 MG tablet Take 15 mg by mouth daily.    [provider]  Biotin 1 MG CAPS Take by mouth.    [provider]  Cholecalciferol (VITAMIN D3) 1000 units CAPS Take by mouth.    [provider]  clonazePAM (KLONOPIN) 1 MG tablet Take 1 mg by mouth at bedtime as needed. 10/22/21   [provider]  cyanocobalamin (VITAMIN B12) 1000 MCG tablet Take 1,000 mcg by mouth daily.    [provider]  lamoTRIgine (LAMICTAL) 100 MG tablet SMARTSIG:1.0 Tablet(s) By Mouth Daily 11/13/22   [provider]  lamoTRIgine (LAMICTAL) 25 MG tablet Take 50 mg by mouth daily. 12/06/21   [provider]  levothyroxine (SYNTHROID) 50 MCG tablet Take 1 tablet (50 mcg total) by mouth daily. 09/22/22   Mort Sawyers, FNP  Multiple Vitamins-Minerals (MULTIVITAMIN ADULT PO) Take by mouth daily.    [provider]  omeprazole (PRILOSEC) 40 MG capsule TAKE 1 CAPSULE (40 MG TOTAL) BY MOUTH DAILY. 10/15/22   Mort Sawyers, FNP  valACYclovir (VALTREX) 500 MG tablet Take 1 tablet (500 mg total) by mouth daily. 04/14/22   Mort Sawyers, FNP  venlafaxine XR (EFFEXOR-XR) 75 MG 24 hr capsule Take 150 mg by mouth daily. 12/06/21   [provider]  Family History Family History  Problem Relation Age of Onset   Leukemia Mother    Depression Father    Drug abuse Sister    Aneurysm Sister    ADD / ADHD Maternal Grandmother    Depression Maternal Grandmother    Learning disabilities Maternal Grandmother    Alcohol abuse Maternal Grandfather    Depression Paternal Grandmother    Breast cancer Paternal Aunt        older age dx    Social History Social History   Tobacco Use   Smoking status: Former    Current packs/day: 0.25    Average packs/day: 0.3 packs/day for 0.5 years (0.1 ttl pk-yrs)    Types: Cigarettes, E-cigarettes   Smokeless tobacco: Never  Vaping Use   Vaping status: Never Used  Substance Use Topics   Alcohol use: No   Drug use: No     Allergies   Patient has no known  allergies.   Review of Systems Review of Systems   Physical Exam Triage Vital Signs ED Triage Vitals  Encounter Vitals Group     BP 12/13/22 1434 (!) 145/90     Systolic BP Percentile --      Diastolic BP Percentile --      Pulse Rate 12/13/22 1434 72     Resp 12/13/22 1434 16     Temp 12/13/22 1434 98.2 F (36.8 C)     Temp Source 12/13/22 1434 Oral     SpO2 12/13/22 1434 97 %     Weight --      Height --      Head Circumference --      Peak Flow --      Pain Score 12/13/22 1433 0     Pain Loc --      Pain Education --      Exclude from Growth Chart --    No data found.  Updated Vital Signs BP (!) 145/90 (BP Location: Left Arm)   Pulse 72   Temp 98.2 F (36.8 C) (Oral)   Resp 16   LMP  (LMP Unknown)   SpO2 97%   Visual Acuity Right Eye Distance:   Left Eye Distance:   Bilateral Distance:    Right Eye Near:   Left Eye Near:    Bilateral Near:     Physical Exam Constitutional:      Appearance: Normal appearance.  Eyes:     Comments: Erythema present to the left conjunctiva with scant yellow drainage noted to the lower lash and upper lash line, vision is grossly intact, extraocular movements intact  Neurological:     Mental Status: She is alert.      UC Treatments / Results  Labs (all labs ordered are listed, but only abnormal results are displayed) Labs Reviewed - No data to display  EKG   Radiology No results found.  Procedures Procedures (including critical care time)  Medications Ordered in UC Medications - No data to display  Initial Impression / Assessment and Plan / UC Course  I have reviewed the triage vital signs and the nursing notes.  Pertinent labs & imaging results that were available during my care of the patient were reviewed by me and considered in my medical decision making (see chart for details).  Bacterial  conjunctivitis of left eye  Presentation consistent with above diagnosis, discussed with patient low suspicion  for corneal abrasion as patient denies pain therefore deferring fluorescein stain, prescribed moxifloxacin and discussed administration recommended additional  supportive measures with follow-up with urgent care as needed Final Clinical Impressions(s) / UC Diagnoses   Final diagnoses:  Bacterial conjunctivitis of left eye     Discharge Instructions      Today you being treated for bacterial conjunctivitis.   Place one drop of Moxifloxacin  into the effected eye every 8 hours while awake for 7 days. If the other eye starts to have symptoms you may use medication in it as well. Do not allow tip of dropper to touch eye.  May use cool compress for comfort and to remove discharge if present. Pat the eye, do not wipe.  If wearing contacts, dispose of current pair. Wear glasses until symptoms have resolved.   Do not rub eyes, this may cause more irritation.  May use benadryl as needed to help if itching present.  Please avoid use of eye makeup until symptoms clear.  If symptoms persist after use of medication, please follow up at Urgent Care or with ophthalmologist (eye doctor)    ED Prescriptions     Medication Sig Dispense Auth. Provider   moxifloxacin (VIGAMOX) 0.5 % ophthalmic solution Place 1 drop into the left eye 3 (three) times daily. 3 mL Valinda Hoar, NP      PDMP not reviewed this encounter.   Valinda Hoar, NP 12/13/22 1505

## 2022-12-13 NOTE — Discharge Instructions (Signed)
Today you being treated for bacterial conjunctivitis.   Place one drop of Moxifloxacin  into the effected eye every 8 hours while awake for 7 days. If the other eye starts to have symptoms you may use medication in it as well. Do not allow tip of dropper to touch eye.  May use cool compress for comfort and to remove discharge if present. Pat the eye, do not wipe.  If wearing contacts, dispose of current pair. Wear glasses until symptoms have resolved.   Do not rub eyes, this may cause more irritation.  May use benadryl as needed to help if itching present.  Please avoid use of eye makeup until symptoms clear.  If symptoms persist after use of medication, please follow up at Urgent Care or with ophthalmologist (eye doctor)

## 2022-12-13 NOTE — ED Triage Notes (Signed)
Pt presents with left eye redness and drainage x 2 days. Pt got some pf the white frost from the artifical christmas tree in her eye.

## 2022-12-16 ENCOUNTER — Encounter: Payer: Self-pay | Admitting: Family

## 2023-01-18 ENCOUNTER — Encounter: Payer: Self-pay | Admitting: Family

## 2023-01-23 ENCOUNTER — Other Ambulatory Visit: Payer: Self-pay | Admitting: Family

## 2023-01-23 DIAGNOSIS — E039 Hypothyroidism, unspecified: Secondary | ICD-10-CM

## 2023-02-23 ENCOUNTER — Encounter: Payer: Self-pay | Admitting: Family

## 2023-02-23 ENCOUNTER — Other Ambulatory Visit: Payer: Self-pay | Admitting: Family

## 2023-02-23 DIAGNOSIS — K219 Gastro-esophageal reflux disease without esophagitis: Secondary | ICD-10-CM

## 2023-02-25 MED ORDER — OMEPRAZOLE 40 MG PO CPDR
40.0000 mg | DELAYED_RELEASE_CAPSULE | Freq: Two times a day (BID) | ORAL | 1 refills | Status: DC
Start: 2023-02-25 — End: 2023-03-23

## 2023-02-25 NOTE — Addendum Note (Signed)
Addended by: Mort Sawyers on: 02/25/2023 07:31 AM   Modules accepted: Orders

## 2023-03-13 ENCOUNTER — Other Ambulatory Visit: Payer: Self-pay | Admitting: Family

## 2023-03-23 ENCOUNTER — Other Ambulatory Visit: Payer: Self-pay | Admitting: Family

## 2023-03-23 DIAGNOSIS — K219 Gastro-esophageal reflux disease without esophagitis: Secondary | ICD-10-CM

## 2023-04-25 ENCOUNTER — Other Ambulatory Visit: Payer: Self-pay | Admitting: Family

## 2023-04-25 DIAGNOSIS — E039 Hypothyroidism, unspecified: Secondary | ICD-10-CM

## 2023-05-11 ENCOUNTER — Encounter: Payer: Self-pay | Admitting: Family

## 2023-05-11 DIAGNOSIS — Z3041 Encounter for surveillance of contraceptive pills: Secondary | ICD-10-CM

## 2023-05-12 MED ORDER — ALYACEN 1/35 1-35 MG-MCG PO TABS: 1.0000 | ORAL_TABLET | Freq: Every day | ORAL | 1 refills | Status: DC

## 2023-06-08 ENCOUNTER — Other Ambulatory Visit: Payer: Self-pay | Admitting: Family

## 2023-06-08 DIAGNOSIS — B009 Herpesviral infection, unspecified: Secondary | ICD-10-CM

## 2023-06-09 NOTE — Telephone Encounter (Signed)
 Patient is needing her medication called in to [facility] on [address]

## 2023-06-29 ENCOUNTER — Ambulatory Visit: Payer: Managed Care, Other (non HMO) | Admitting: Dermatology

## 2023-07-26 ENCOUNTER — Other Ambulatory Visit: Payer: Self-pay | Admitting: Family

## 2023-07-26 DIAGNOSIS — E039 Hypothyroidism, unspecified: Secondary | ICD-10-CM

## 2023-09-19 ENCOUNTER — Other Ambulatory Visit: Payer: Self-pay | Admitting: Family

## 2023-09-19 DIAGNOSIS — E039 Hypothyroidism, unspecified: Secondary | ICD-10-CM

## 2023-09-22 ENCOUNTER — Telehealth: Payer: Self-pay | Admitting: Family

## 2023-09-22 NOTE — Telephone Encounter (Signed)
 Copied from CRM [id number]. Topic: Clinical - Request for Lab/Test Order >> Sep 22, 2023 10:58 AM Mesmerise C wrote: Reason for CRM: Patient getting physical on 12/4 would like labs done before then

## 2023-09-22 NOTE — Telephone Encounter (Signed)
 Spoke with pt and advised her that [place] does not do lab work prior to [employer]. She verbalized understanding. Nothing further was needed.

## 2023-11-01 ENCOUNTER — Encounter: Payer: Self-pay | Admitting: Family

## 2023-11-02 ENCOUNTER — Ambulatory Visit: Payer: Self-pay

## 2023-11-02 NOTE — Telephone Encounter (Signed)
 FYI Only or Action Required?: FYI only for provider.  Patient was last seen in primary care on 12/12/2022 by Wendee Lynwood HERO, NP.  Called Nurse Triage reporting Sore Throat.  Symptoms began a week ago.  Interventions attempted: Nothing.  Symptoms are: unchanged.  Triage Disposition: See PCP When Office is Open (Within 3 Days)  Patient/caregiver understands and will follow disposition?: Yes   Copied from CRM [id number]. Topic: Clinical - Red Word Triage >> Nov 02, 2023  9:30 AM Rosina BIRCH wrote: Red Word that prompted transfer to Nurse Triage: sore throat, body aches, cold Reason for Disposition  [1] Sore throat is the only symptom AND [2] present > 48 hours  Answer Assessment - Initial Assessment Questions Patient says the sore throat has been ongoing x 1 week after she had a cold. She reports congestion in the chest w/green phlegm, SOB w/coughing and some moving around that recovers at rest. More concerned about sore throat at this time, hoarseness. Reports body aches that started today. Advised no availaiblity with PCP today. She says she wants to be seen today by any provider at any location. Advised earliest is tomorrow at 0820 at [facility] with Leron Glance, NP, she agreed to that appointment and asked to be placed on the waiting list for any provider at any location. Edited to include all [facility] and [facility] for cancellation today.    1. ONSET: When did the throat start hurting? (Hours or days ago)      1 week  2. SEVERITY: How bad is the sore throat? (Scale 1-10; mild, moderate or severe)     8  3. STREP EXPOSURE: Has there been any exposure to strep within the past week? If Yes, ask: What type of contact occurred?      No  4.  VIRAL SYMPTOMS: Are there any symptoms of a cold, such as a runny nose, cough, hoarse voice or red eyes?      Yes-runny nose, nasal congestion, hoarse voice  5. FEVER: Do you have a fever? If Yes, ask: What is  your temperature, how was it measured, and when did it start?     Unable to measure, no thermometer  6. PUS ON THE TONSILS: Is there pus on the tonsils in the back of your throat?     No  7. OTHER SYMPTOMS: Do you have any other symptoms? (e.g., difficulty breathing, headache, rash)     Hoarseness, body aches, cough w/light green mucus, difficulty breathing w/coughing mainly  Protocols used: Sore Throat-A-AH

## 2023-11-02 NOTE — Telephone Encounter (Signed)
 Hung up before transfer to NT.   Copied from CRM [id number]. Topic: Clinical - Red Word Triage >> Nov 02, 2023  8:43 AM Chelsea Jackson wrote: Red Word that prompted transfer to Nurse Triage: Sore throat for weeks, body aches, congestion, cough  Requesting appt for today with anybody

## 2023-11-03 ENCOUNTER — Encounter: Payer: Self-pay | Admitting: Nurse Practitioner

## 2023-11-03 ENCOUNTER — Ambulatory Visit: Admitting: Nurse Practitioner

## 2023-11-03 VITALS — BP 146/93 | HR 76 | Temp 98.4°F | Ht 61.0 in | Wt 161.0 lb

## 2023-11-03 DIAGNOSIS — R051 Acute cough: Secondary | ICD-10-CM

## 2023-11-03 DIAGNOSIS — J40 Bronchitis, not specified as acute or chronic: Secondary | ICD-10-CM | POA: Diagnosis not present

## 2023-11-03 DIAGNOSIS — J029 Acute pharyngitis, unspecified: Secondary | ICD-10-CM

## 2023-11-03 LAB — POC COVID19 BINAXNOW: SARS Coronavirus 2 Ag: NEGATIVE

## 2023-11-03 LAB — POCT RAPID STREP A (OFFICE): Rapid Strep A Screen: NEGATIVE

## 2023-11-03 LAB — POCT INFLUENZA A/B
Influenza A, POC: NEGATIVE
Influenza B, POC: NEGATIVE

## 2023-11-03 MED ORDER — AMOXICILLIN-POT CLAVULANATE 875-125 MG PO TABS: 1.0000 | ORAL_TABLET | Freq: Two times a day (BID) | ORAL | 0 refills | Status: DC

## 2023-11-03 MED ORDER — PSEUDOEPH-BROMPHEN-DM 30-2-10 MG/5ML PO SYRP: 5.0000 mL | ORAL_SOLUTION | Freq: Four times a day (QID) | ORAL | 0 refills | Status: DC | PRN

## 2023-11-03 MED ORDER — DEXAMETHASONE SOD PHOSPHATE PF 10 MG/ML IJ SOLN
8.0000 mg | Freq: Once | INTRAMUSCULAR | Status: AC
  Administered 2023-11-03: 8 mg via INTRAMUSCULAR

## 2023-11-03 MED ORDER — ALBUTEROL SULFATE HFA 108 (90 BASE) MCG/ACT IN AERS: 2.0000 | INHALATION_SPRAY | Freq: Four times a day (QID) | RESPIRATORY_TRACT | 2 refills | Status: AC | PRN

## 2023-11-03 NOTE — Assessment & Plan Note (Signed)
 Symptoms have persisted for 3-4 weeks, including cough, sore throat, hoarseness, congestion and mild dyspnea. Tests are negative for COIVD, influenza and streptococcal pharyngitis, but tonsillar enlargement is present. Differential diagnosis includes bronchitis and pharyngitis. Administered a steroid injection in office. Start Augmentin 875 BID x 10 days. Albuterol inhaler as needed for shortness of breath and Bromfed DM for cough, counseled on common side effects. Advised taking probiotics to prevent antibiotic-associated diarrhea. Encouraged fluid intake and use of acetaminophen/ibuprofen as needed for symptom relief. Return precautions given to patient.

## 2023-11-03 NOTE — Progress Notes (Signed)
 Patient is in office today provider provided verbal orders for a  Dexamethasone injection. Patient Injection was given in the  Left vastus lateralis. Patient tolerated injection well. 0.76mL was drawn from a 10 mL vial leaving a 0.2 mL waste.

## 2023-11-03 NOTE — Progress Notes (Signed)
 Leron Glance, NP-C Phone: [phone]  Chelsea Jackson is a 38 y.o. female who presents today for sore throat and cough.   Discussed the use of AI scribe software for clinical note transcription with the patient, who gave verbal consent to proceed.  History of Present Illness   Chelsea Jackson is a 38 year old female who presents with worsening cough, sore throat, and congestion lasting for several weeks.  She initially experienced congestion and a sensation of being 'stopped up' about a month ago, which resolved on its own. A week later, she developed a sore throat and cough. Her symptoms have persisted and worsened over the past three to four weeks, despite taking over-the-counter medications such as Mucinex and DayQuil, which have not provided relief.  Her symptoms include body aches, coughing, mucus production, and shortness of breath, particularly at night. She also reports hoarseness and difficulty talking, which has been present for at least a couple of weeks. She experiences drainage running down her throat and a raw sensation when swallowing, although she does not have trouble swallowing. No fever, but she notes fatigue, body aches, and headaches lately. No nausea or vomiting, but she mentions a gag reflex at night, likely due to coughing.  She has a history of losing her voice at least twice a year and reports that she talks a lot and sometimes strains her voice. She is normally healthy but experiences these symptoms periodically. She mentions having reflux but is on medication for it.  In terms of medication, she is currently taking a probiotic and vitamins. She has used an inhaler in the past and wants to use one again due to her shortness of breath, especially at night.  She works in a human resources officer business and has not been exposed to any known illnesses recently. Her symptoms are worse at night, with increased coughing and hoarseness. During the review of symptoms, she confirms  congestion, sore throat, hoarseness, fatigue, body aches, headache, and shortness of breath. No ear pain, nausea, vomiting, or fever.      Social History   Tobacco Use  Smoking Status Former   Current packs/day: 0.25   Average packs/day: 0.3 packs/day for 0.5 years (0.1 ttl pk-yrs)   Types: Cigarettes, E-cigarettes  Smokeless Tobacco Never    Current Outpatient Medications on File Prior to Visit  Medication Sig Dispense Refill   amphetamine -dextroamphetamine  (ADDERALL XR) 30 MG 24 hr capsule Take 30 mg by mouth in the morning and at bedtime.     amphetamine -dextroamphetamine  (ADDERALL) 15 MG tablet Take 15 mg by mouth daily.     Biotin 1 MG CAPS Take by mouth.     Cholecalciferol (VITAMIN D3) 1000 units CAPS Take by mouth.     clonazePAM (KLONOPIN) 1 MG tablet Take 1 mg by mouth at bedtime as needed.     cyanocobalamin (VITAMIN B12) 1000 MCG tablet Take 1,000 mcg by mouth daily.     lamoTRIgine (LAMICTAL) 100 MG tablet SMARTSIG:1.0 Tablet(s) By Mouth Daily     lamoTRIgine (LAMICTAL) 25 MG tablet Take 50 mg by mouth daily.     levothyroxine  (SYNTHROID ) 50 MCG tablet TAKE 1 TABLET BY MOUTH EVERY DAY 90 tablet 0   moxifloxacin  (VIGAMOX ) 0.5 % ophthalmic solution Place 1 drop into the left eye 3 (three) times daily. 3 mL 0   Multiple Vitamins-Minerals (MULTIVITAMIN ADULT PO) Take by mouth daily.     norethindrone -ethinyl estradiol  1/35 (ALYACEN  1/35) tablet Take 1 tablet by mouth daily. 84 tablet 1  omeprazole  (PRILOSEC) 40 MG capsule TAKE 1 CAPSULE (40 MG TOTAL) BY MOUTH IN THE MORNING AND AT BEDTIME. 180 capsule 1   valACYclovir  (VALTREX ) 500 MG tablet TAKE 1 TABLET (500 MG TOTAL) BY MOUTH DAILY. 30 tablet 11   venlafaxine XR (EFFEXOR-XR) 75 MG 24 hr capsule Take 150 mg by mouth daily.     No current facility-administered medications on file prior to visit.     ROS see history of present illness  Objective  Physical Exam Vitals:   11/03/23 0840  BP: (!) 146/93  Pulse: 76   Temp: 98.4 F (36.9 C)  SpO2: 99%    BP Readings from Last 3 Encounters:  11/03/23 (!) 146/93  12/13/22 (!) 145/90  10/24/22 120/86   Wt Readings from Last 3 Encounters:  11/03/23 161 lb (73 kg)  10/24/22 155 lb 6.4 oz (70.5 kg)  03/13/22 156 lb 6.4 oz (70.9 kg)    Physical Exam Constitutional:      General: She is not in acute distress.    Appearance: Normal appearance.  HENT:     Head: Normocephalic.     Right Ear: Tympanic membrane normal.     Left Ear: Tympanic membrane normal.     Nose: Congestion present.     Mouth/Throat:     Mouth: Mucous membranes are moist.     Pharynx: Oropharynx is clear. Posterior oropharyngeal erythema present.     Tonsils: No tonsillar exudate. 3+ on the right. 3+ on the left.  Eyes:     Conjunctiva/sclera: Conjunctivae normal.     Pupils: Pupils are equal, round, and reactive to light.  Cardiovascular:     Rate and Rhythm: Normal rate and regular rhythm.     Heart sounds: Normal heart sounds.  Pulmonary:     Effort: Pulmonary effort is normal.     Breath sounds: Normal breath sounds.  Lymphadenopathy:     Cervical: No cervical adenopathy.  Skin:    General: Skin is warm and dry.  Neurological:     General: No focal deficit present.     Mental Status: She is alert.  Psychiatric:        Mood and Affect: Mood normal.        Behavior: Behavior normal.      Assessment/Plan: Please see individual problem list.  Bronchitis Assessment & Plan: Symptoms have persisted for 3-4 weeks, including cough, sore throat, hoarseness, congestion and mild dyspnea. Tests are negative for COIVD, influenza and streptococcal pharyngitis, but tonsillar enlargement is present. Differential diagnosis includes bronchitis and pharyngitis. Administered a steroid injection in office. Start Augmentin 875 BID x 10 days. Albuterol inhaler as needed for shortness of breath and Bromfed DM for cough, counseled on common side effects. Advised taking probiotics to  prevent antibiotic-associated diarrhea. Encouraged fluid intake and use of acetaminophen/ibuprofen as needed for symptom relief. Return precautions given to patient.   Orders: -     dexAMETHasone Sod Phosphate PF -     Amoxicillin -Pot Clavulanate; Take 1 tablet by mouth 2 (two) times daily.  Dispense: 20 tablet; Refill: 0 -     Albuterol Sulfate HFA; Inhale 2 puffs into the lungs every 6 (six) hours as needed for wheezing or shortness of breath.  Dispense: 8.5 g; Refill: 2 -     Pseudoeph-Bromphen-DM; Take 5-10 mLs by mouth every 6 (six) hours as needed.  Dispense: 120 mL; Refill: 0  Sore throat -     POCT Influenza A/B -     POC COVID-19  BinaxNow -     POCT rapid strep A -     dexAMETHasone Sod Phosphate PF -     Amoxicillin -Pot Clavulanate; Take 1 tablet by mouth 2 (two) times daily.  Dispense: 20 tablet; Refill: 0  Acute cough -     POCT Influenza A/B -     POC COVID-19 BinaxNow     Return if symptoms worsen or fail to improve.   Leron Glance, NP-C [facility] - [facility]

## 2023-11-10 ENCOUNTER — Other Ambulatory Visit: Payer: Self-pay | Admitting: Family

## 2023-11-10 DIAGNOSIS — Z3041 Encounter for surveillance of contraceptive pills: Secondary | ICD-10-CM

## 2023-11-27 ENCOUNTER — Encounter: Payer: Self-pay | Admitting: Family

## 2023-11-27 DIAGNOSIS — K219 Gastro-esophageal reflux disease without esophagitis: Secondary | ICD-10-CM

## 2023-11-30 ENCOUNTER — Ambulatory Visit (INDEPENDENT_AMBULATORY_CARE_PROVIDER_SITE_OTHER): Admitting: Nurse Practitioner

## 2023-11-30 VITALS — BP 150/102 | HR 78 | Temp 97.6°F | Ht 61.0 in | Wt 157.6 lb

## 2023-11-30 DIAGNOSIS — R0982 Postnasal drip: Secondary | ICD-10-CM

## 2023-11-30 DIAGNOSIS — J040 Acute laryngitis: Secondary | ICD-10-CM

## 2023-11-30 MED ORDER — AZITHROMYCIN 250 MG PO TABS: ORAL_TABLET | ORAL | 0 refills | Status: AC

## 2023-11-30 MED ORDER — FLUTICASONE PROPIONATE 50 MCG/ACT NA SUSP: 2.0000 | Freq: Every day | NASAL | 6 refills | Status: AC

## 2023-11-30 MED ORDER — METHYLPREDNISOLONE 4 MG PO TBPK: ORAL_TABLET | ORAL | 0 refills | Status: DC

## 2023-11-30 NOTE — Progress Notes (Signed)
 Leron Glance, NP-C Phone: [phone]  Chelsea Jackson is a 38 y.o. female who presents today for hoarseness.   Discussed the use of AI scribe software for clinical note transcription with the patient, who gave verbal consent to proceed.  History of Present Illness   Chelsea Jackson is a 38 year old female who presents with recurrent sore throat and hoarseness.  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 Symptoms include hoarseness, redness, loss of voice, and a burning sensation. She has tried various home remedies such as salt water gargles and products purchased online, but these have not provided relief.  She recalls a similar episode three to four years ago when she was diagnosed with laryngitis and bronchitis and was treated with antibiotics. She questions the accuracy of her previous diagnosis of bronchitis, as she was negative for other infections at that time. She reports that a past visit to an ENT specialist did not reveal any concerning findings according to her recollection.  She has been experiencing postnasal drip, with symptoms of mucus production and congestion. She has recently started taking an antihistamine to address these symptoms. She also reports a history of acid reflux and increased stress, which she believes may contribute to her symptoms.  No fever, fatigue, body aches, headache, nausea, or vomiting. She reports some chest congestion affecting her breathing, but no head congestion. She has a history of COVID-19 infection years ago.  Her current medications include Claritin 10 mg daily for allergies. She has previously been treated with Augmentin  for similar symptoms.      Social History   Tobacco Use  Smoking Status Former   Current packs/day: 0.25   Average packs/day: 0.3 packs/day for 0.5 years (0.1 ttl pk-yrs)   Types: Cigarettes, E-cigarettes  Smokeless  Tobacco Never    Current Outpatient Medications on File Prior to Visit  Medication Sig Dispense Refill   albuterol  (VENTOLIN  HFA) 108 (90 Base) MCG/ACT inhaler Inhale 2 puffs into the lungs every 6 (six) hours as needed for wheezing or shortness of breath. 8.5 g 2   amphetamine -dextroamphetamine  (ADDERALL XR) 30 MG 24 hr capsule Take 30 mg by mouth in the morning and at bedtime.     amphetamine -dextroamphetamine  (ADDERALL) 15 MG tablet Take 15 mg by mouth daily.     Biotin 1 MG CAPS Take by mouth.     Cholecalciferol (VITAMIN D3) 1000 units CAPS Take by mouth.     clonazePAM (KLONOPIN) 1 MG tablet Take 1 mg by mouth at bedtime as needed.     cyanocobalamin (VITAMIN B12) 1000 MCG tablet Take 1,000 mcg by mouth daily.     lamoTRIgine (LAMICTAL) 100 MG tablet SMARTSIG:1.0 Tablet(s) By Mouth Daily     lamoTRIgine (LAMICTAL) 25 MG tablet Take 50 mg by mouth daily.     levothyroxine  (SYNTHROID ) 50 MCG tablet TAKE 1 TABLET BY MOUTH EVERY DAY 90 tablet 0   Multiple Vitamins-Minerals (MULTIVITAMIN ADULT PO) Take by mouth daily.     norethindrone -ethinyl estradiol  1/35 (ALYACEN  1/35) tablet TAKE 1 TABLET BY MOUTH EVERY DAY 84 tablet 0   valACYclovir  (VALTREX ) 500 MG tablet TAKE 1 TABLET (500 MG TOTAL) BY MOUTH DAILY. 30 tablet 11   venlafaxine XR (EFFEXOR-XR) 75 MG 24 hr capsule Take 150 mg by mouth daily.     No current facility-administered medications on file prior to visit.     ROS see history  of present illness  Objective  Physical Exam Vitals:   11/30/23 0959  BP: (!) 150/102  Pulse: 78  Temp: 97.6 F (36.4 C)  SpO2: 96%    BP Readings from Last 3 Encounters:  12/10/23 (!) 142/96  11/30/23 (!) 150/102  11/03/23 (!) 146/93   Wt Readings from Last 3 Encounters:  12/10/23 162 lb 12.8 oz (73.8 kg)  11/30/23 157 lb 9.6 oz (71.5 kg)  11/03/23 161 lb (73 kg)    Physical Exam Constitutional:      General: She is not in acute distress.    Appearance: Normal appearance.  HENT:      Head: Normocephalic.     Right Ear: Tympanic membrane normal.     Left Ear: Tympanic membrane normal.     Nose: Nose normal.     Mouth/Throat:     Mouth: Mucous membranes are moist.     Pharynx: Oropharynx is clear. Posterior oropharyngeal erythema and postnasal drip present.  Eyes:     Conjunctiva/sclera: Conjunctivae normal.     Pupils: Pupils are equal, round, and reactive to light.  Cardiovascular:     Rate and Rhythm: Normal rate and regular rhythm.     Heart sounds: Normal heart sounds.  Pulmonary:     Effort: Pulmonary effort is normal.     Breath sounds: Normal breath sounds.  Lymphadenopathy:     Cervical: No cervical adenopathy.  Skin:    General: Skin is warm and dry.  Neurological:     General: No focal deficit present.     Mental Status: She is alert.  Psychiatric:        Mood and Affect: Mood normal.        Behavior: Behavior normal.      Assessment/Plan: Please see individual problem list.  Laryngitis Assessment & Plan: Acute laryngitis presents with hoarseness and sore throat, likely from chemical exposure and postnasal drip. No bacterial infection or systemic symptoms are present, but laryngeal inflammation is suspected. Prescribe MDP. Use Flonase  nasal spray, two sprays in each nostril daily, and continue Claritin 10 mg daily. Prescribe azithromycin  if symptoms worsen or persist by Wednesday. Advise vocal rest and consumption of warm, soothing liquids. Return precautions given to patient.   Orders: -     Azithromycin ; Take 2 tablets on day 1, then 1 tablet daily on days 2 through 5  Dispense: 6 tablet; Refill: 0  Post-nasal drip -     Fluticasone  Propionate; Place 2 sprays into both nostrils daily.  Dispense: 16 g; Refill: 6     Return if symptoms worsen or fail to improve.   Leron Glance, NP-C [facility] - [facility]

## 2023-12-07 MED ORDER — OMEPRAZOLE 40 MG PO CPDR: 40.0000 mg | DELAYED_RELEASE_CAPSULE | Freq: Two times a day (BID) | ORAL | 0 refills | Status: DC

## 2023-12-10 ENCOUNTER — Ambulatory Visit: Admitting: Family

## 2023-12-10 ENCOUNTER — Ambulatory Visit: Payer: Self-pay | Admitting: Family

## 2023-12-10 ENCOUNTER — Telehealth: Payer: Self-pay | Admitting: Family

## 2023-12-10 ENCOUNTER — Encounter (INDEPENDENT_AMBULATORY_CARE_PROVIDER_SITE_OTHER): Payer: Self-pay

## 2023-12-10 VITALS — BP 142/96 | HR 83 | Temp 98.1°F | Ht 61.0 in | Wt 162.8 lb

## 2023-12-10 DIAGNOSIS — E7800 Pure hypercholesterolemia, unspecified: Secondary | ICD-10-CM

## 2023-12-10 DIAGNOSIS — J040 Acute laryngitis: Secondary | ICD-10-CM

## 2023-12-10 DIAGNOSIS — R739 Hyperglycemia, unspecified: Secondary | ICD-10-CM

## 2023-12-10 DIAGNOSIS — E039 Hypothyroidism, unspecified: Secondary | ICD-10-CM

## 2023-12-10 DIAGNOSIS — B9689 Other specified bacterial agents as the cause of diseases classified elsewhere: Secondary | ICD-10-CM

## 2023-12-10 DIAGNOSIS — J029 Acute pharyngitis, unspecified: Secondary | ICD-10-CM

## 2023-12-10 DIAGNOSIS — Z23 Encounter for immunization: Secondary | ICD-10-CM

## 2023-12-10 DIAGNOSIS — K219 Gastro-esophageal reflux disease without esophagitis: Secondary | ICD-10-CM

## 2023-12-10 DIAGNOSIS — E538 Deficiency of other specified B group vitamins: Secondary | ICD-10-CM

## 2023-12-10 DIAGNOSIS — Z1231 Encounter for screening mammogram for malignant neoplasm of breast: Secondary | ICD-10-CM

## 2023-12-10 LAB — LIPID PANEL
Cholesterol: 148 mg/dL (ref 0–200)
HDL: 50.4 mg/dL (ref >39.00)
LDL Cholesterol: 58 mg/dL (ref 0–99)
NonHDL: 97.63
Total CHOL/HDL Ratio: 3
Triglycerides: 196 mg/dL — ABNORMAL HIGH (ref 0.0–149.0)
VLDL: 39.2 mg/dL (ref 0.0–40.0)

## 2023-12-10 LAB — CBC WITH DIFFERENTIAL/PLATELET
Basophils Absolute: 0.1 K/uL (ref 0.0–0.1)
Basophils Relative: 0.6 % (ref 0.0–3.0)
Eosinophils Absolute: 0.4 K/uL (ref 0.0–0.7)
Eosinophils Relative: 3.2 % (ref 0.0–5.0)
HCT: 42.8 % (ref 36.0–46.0)
Hemoglobin: 14.6 g/dL (ref 12.0–15.0)
Lymphocytes Relative: 17.1 % (ref 12.0–46.0)
Lymphs Abs: 2 K/uL (ref 0.7–4.0)
MCHC: 34.1 g/dL (ref 30.0–36.0)
MCV: 93.5 fl (ref 78.0–100.0)
Monocytes Absolute: 0.3 K/uL (ref 0.1–1.0)
Monocytes Relative: 2.7 % — ABNORMAL LOW (ref 3.0–12.0)
Neutro Abs: 8.9 K/uL — ABNORMAL HIGH (ref 1.4–7.7)
Neutrophils Relative %: 76.4 % (ref 43.0–77.0)
Platelets: 411 K/uL — ABNORMAL HIGH (ref 150.0–400.0)
RBC: 4.58 Mil/uL (ref 3.87–5.11)
RDW: 12.9 % (ref 11.5–15.5)
WBC: 11.6 K/uL — ABNORMAL HIGH (ref 4.0–10.5)

## 2023-12-10 LAB — TSH: TSH: 1.6 u[IU]/mL (ref 0.35–5.50)

## 2023-12-10 LAB — HEMOGLOBIN A1C: Hgb A1c MFr Bld: 5.1 % (ref 4.6–6.5)

## 2023-12-10 MED ORDER — AZITHROMYCIN 250 MG PO TABS: ORAL_TABLET | ORAL | 0 refills | Status: AC

## 2023-12-10 MED ORDER — PANTOPRAZOLE SODIUM 40 MG PO TBEC: 40.0000 mg | DELAYED_RELEASE_TABLET | Freq: Every day | ORAL | 2 refills | Status: AC

## 2023-12-10 NOTE — Patient Instructions (Addendum)
 I have sent an electronic order over to your preferred location for the following:   []   2D Mammogram  [x]   3D Mammogram  []   Bone Density   Please give this center a call to get scheduled at your convenience.   [x]   [facility]      [address] [place], [place]        [phone]         Make sure to wear two piece  clothing  No lotions powders or deodorants the day of the appointment Make sure to bring picture ID and insurance card.  Bring list of medications you are currently taking including any supplements.   ------------------------------------  Stop omeprazole   Start pantoprazole     ------------------------------------  Ask about possible coverage for the following:   If not diabetic:  Wegovy  Zepbound Saxenda    ------------------------------------

## 2023-12-10 NOTE — Progress Notes (Signed)
 Established Patient Office Visit  Subjective:      CC:  Chief Complaint  Patient presents with   Acute Visit    HPI: Chelsea Jackson is a 38 y.o. female presenting on 12/10/2023 for Acute Visit .  Discussed the use of AI scribe software for clinical note transcription with the patient, who gave verbal consent to proceed.  History of Present Illness Chelsea Jackson is a 38 year old female who presents with persistent hoarseness and laryngitis.  She has been experiencing persistent hoarseness and laryngitis for about two months. Initially diagnosed with bronchitis, she was treated with antibiotics, steroids, Flonase , Claritin, and a Z-Pak, but her symptoms have not resolved. Her voice loss sometimes makes it difficult to talk, and her sore throat worsens at night.  She has a history of heartburn and acid reflux, managed with a high dose of omeprazole . Her heartburn worsens with poor diet and stress but is controlled when she adheres to her medication regimen. She underwent an endoscopy in 2018, which was normal.  She resides in a camper with cats and dogs, which may contribute to her symptoms. She uses Claritin and Flonase  daily for allergies but is unsure of their effectiveness. She reports exposure to dust and cleaning chemicals, which she believes may have impacted her respiratory health.  She experiences occasional shortness of breath, primarily through her nose, and reports coughing and mucus production. No fever, sneezing, itchy or watery eyes, or crusting in the morning. She also mentions a recent bump near her eye.  Her energy levels have been low, attributed to stress from her living situation and recent life events. She has a history of weight gain, primarily in her abdomen, and acknowledges a need to lose weight. She engages in some physical activity through walking and house cleaning but has not been exercising regularly due to feeling unwell.     The beneficiary does not  have any FDA labeled contraindications to the requested agent including pregnancy, lactation, h/o medullary thyroid  cancer or multiple endocrine neoplasia type II.     Social history:  Relevant past medical, surgical, family and social history reviewed and updated as indicated. Interim medical history since our last visit reviewed.  Allergies and medications reviewed and updated.  DATA REVIEWED: CHART IN EPIC     ROS: Negative unless specifically indicated above in HPI.    Current Outpatient Medications:    albuterol  (VENTOLIN  HFA) 108 (90 Base) MCG/ACT inhaler, Inhale 2 puffs into the lungs every 6 (six) hours as needed for wheezing or shortness of breath., Disp: 8.5 g, Rfl: 2   amphetamine -dextroamphetamine  (ADDERALL XR) 30 MG 24 hr capsule, Take 30 mg by mouth in the morning and at bedtime., Disp: , Rfl:    amphetamine -dextroamphetamine  (ADDERALL) 15 MG tablet, Take 15 mg by mouth daily., Disp: , Rfl:    azithromycin  (ZITHROMAX ) 250 MG tablet, Take 2 tablets on day 1, then 1 tablet daily on days 2 through 5, Disp: 6 tablet, Rfl: 0   Biotin 1 MG CAPS, Take by mouth., Disp: , Rfl:    Cholecalciferol (VITAMIN D3) 1000 units CAPS, Take by mouth., Disp: , Rfl:    clonazePAM (KLONOPIN) 1 MG tablet, Take 1 mg by mouth at bedtime as needed., Disp: , Rfl:    cyanocobalamin (VITAMIN B12) 1000 MCG tablet, Take 1,000 mcg by mouth daily., Disp: , Rfl:    fluticasone  (FLONASE ) 50 MCG/ACT nasal spray, Place 2 sprays into both nostrils daily., Disp: 16 g, Rfl: 6  lamoTRIgine (LAMICTAL) 100 MG tablet, SMARTSIG:1.0 Tablet(s) By Mouth Daily, Disp: , Rfl:    lamoTRIgine (LAMICTAL) 25 MG tablet, Take 50 mg by mouth daily., Disp: , Rfl:    levothyroxine  (SYNTHROID ) 50 MCG tablet, TAKE 1 TABLET BY MOUTH EVERY DAY, Disp: 90 tablet, Rfl: 0   Multiple Vitamins-Minerals (MULTIVITAMIN ADULT PO), Take by mouth daily., Disp: , Rfl:    norethindrone -ethinyl estradiol  1/35 (ALYACEN  1/35) tablet, TAKE 1 TABLET BY  MOUTH EVERY DAY, Disp: 84 tablet, Rfl: 0   pantoprazole  (PROTONIX ) 40 MG tablet, Take 1 tablet (40 mg total) by mouth daily., Disp: 60 tablet, Rfl: 2   valACYclovir  (VALTREX ) 500 MG tablet, TAKE 1 TABLET (500 MG TOTAL) BY MOUTH DAILY., Disp: 30 tablet, Rfl: 11   venlafaxine XR (EFFEXOR-XR) 75 MG 24 hr capsule, Take 150 mg by mouth daily., Disp: , Rfl:         Objective:        BP (!) 142/96 (BP Location: Left Arm, Patient Position: Sitting, Cuff Size: Normal)   Pulse 83   Temp 98.1 F (36.7 C) (Temporal)   Ht 5' 1 (1.549 m)   Wt 162 lb 12.8 oz (73.8 kg)   SpO2 99%   BMI 30.76 kg/m   Physical Exam GENERAL: Clammy appearance. HEENT: Enlarged tonsils, slightly red, with drainage. Nasal mucosa swollen.  Wt Readings from Last 3 Encounters:  12/10/23 162 lb 12.8 oz (73.8 kg)  11/30/23 157 lb 9.6 oz (71.5 kg)  11/03/23 161 lb (73 kg)    Physical Exam Constitutional:      General: She is not in acute distress.    Appearance: Normal appearance. She is obese. She is not ill-appearing, toxic-appearing or diaphoretic.  HENT:     Head: Normocephalic.     Right Ear: Tympanic membrane normal.     Left Ear: Tympanic membrane normal.     Nose: Nose normal.     Mouth/Throat:     Mouth: Mucous membranes are dry.     Pharynx: Posterior oropharyngeal erythema and postnasal drip present. No oropharyngeal exudate.     Tonsils: 2+ on the right. 2+ on the left.  Eyes:     Extraocular Movements: Extraocular movements intact.     Pupils: Pupils are equal, round, and reactive to light.  Neck:     Thyroid : No thyroid  mass or thyromegaly.  Cardiovascular:     Rate and Rhythm: Normal rate and regular rhythm.     Pulses: Normal pulses.     Heart sounds: Normal heart sounds.  Pulmonary:     Effort: Pulmonary effort is normal.     Breath sounds: Normal breath sounds.  Abdominal:     General: Bowel sounds are normal.     Palpations: Abdomen is soft.  Musculoskeletal:     Cervical back:  Normal range of motion.  Lymphadenopathy:     Cervical:     Right cervical: No superficial cervical adenopathy.    Left cervical: No superficial cervical adenopathy.  Neurological:     General: No focal deficit present.     Mental Status: She is alert and oriented to person, place, and time. Mental status is at baseline.  Psychiatric:        Mood and Affect: Mood normal.        Behavior: Behavior normal.        Thought Content: Thought content normal.        Judgment: Judgment normal.          Results  DIAGNOSTIC Endoscopy: Normal (2018)  Assessment & Plan:   Assessment and Plan Assessment & Plan Chronic laryngitis with hoarseness and sore throat Hoarseness and sore throat persisting for over a month, possibly due to environmental factors or laryngeal reflux. Differential includes laryngeal reflux and environmental allergies. Previous endoscopy in 2018 was normal. Symptoms include chronic cough, mucus production, and sore throat, exacerbated by lying down. Possible laryngeal reflux causing symptoms in the voice box. - Referred to ENT for evaluation of laryngeal issues. - Ordered thyroid  function tests to rule out thyroid -related causes. - Prescribed Z-Pak for potential infection. - Continue Claritin and Flonase  for allergy management.  Chronic gastroesophageal reflux disease with persistent symptoms Persistent GERD symptoms despite high-dose omeprazole . Possible laryngeal reflux contributing to symptoms. Previous endoscopy in 2018 was normal. Symptoms include heartburn, exacerbated by stress and dietary factors. Discussed risks of long-term PPI use, including absorption issues and dementia. - Switched from omeprazole  to pantoprazole  for GERD management. - Referred to GI for further evaluation of GERD and potential laryngeal reflux. - Discussed dietary modifications to manage GERD symptoms.  Obesity Difficulty in weight management. Discussed potential use of GLP-1 agonists for  weight loss, contingent on insurance coverage. Emphasized importance of diet and exercise. Discussed potential benefits and insurance coverage challenges of GLP-1 agonists. - Advised to contact insurance to inquire about coverage for GLP-1 agonists. - Encouraged dietary modifications and regular exercise.  Environmental allergies with nasal congestion and animal exposure Nasal congestion and potential allergy symptoms due to environmental factors, including exposure to animals and dust in a camper setting. Symptoms include nasal congestion and mucus production. - Continue Claritin and Flonase  for allergy management. - Advised on minimizing exposure to allergens, including animals and dust.  Acquired hypothyroidism Managed with levothyroxine . Thyroid  function to be re-evaluated due to potential contribution to hoarseness and sore throat. - Ordered thyroid  function tests to assess current thyroid  status.  Screening mammogram for breast cancer Due for screening mammogram as she is now 38 years old. - Provided contact information for scheduling a mammogram in [place].        Return in about 4 months (around 04/09/2024) for f/u CPE.     Ginger Patrick, MSN, APRN, FNP-C  [facility]

## 2023-12-10 NOTE — Telephone Encounter (Signed)
 Called patient Chelsea Jackson would like patient to come back in to have the breath test done instead of patient completing the stool sample kit. Patient can not drink or eat or chew gum for one hour prior to test.

## 2023-12-14 LAB — H. PYLORI BREATH TEST: H. pylori Breath Test: NOT DETECTED

## 2023-12-18 DIAGNOSIS — J040 Acute laryngitis: Secondary | ICD-10-CM | POA: Insufficient documentation

## 2023-12-18 NOTE — Assessment & Plan Note (Signed)
 Acute laryngitis presents with hoarseness and sore throat, likely from chemical exposure and postnasal drip. No bacterial infection or systemic symptoms are present, but laryngeal inflammation is suspected. Prescribe MDP. Use Flonase  nasal spray, two sprays in each nostril daily, and continue Claritin 10 mg daily. Prescribe azithromycin  if symptoms worsen or persist by Wednesday. Advise vocal rest and consumption of warm, soothing liquids. Return precautions given to patient.

## 2023-12-24 ENCOUNTER — Encounter: Payer: Self-pay | Admitting: Family

## 2023-12-24 DIAGNOSIS — J040 Acute laryngitis: Secondary | ICD-10-CM

## 2023-12-24 DIAGNOSIS — K219 Gastro-esophageal reflux disease without esophagitis: Secondary | ICD-10-CM

## 2023-12-25 ENCOUNTER — Encounter: Payer: Self-pay | Admitting: Family

## 2024-01-05 ENCOUNTER — Encounter: Payer: Self-pay | Admitting: Family

## 2024-01-08 ENCOUNTER — Encounter: Payer: Self-pay | Admitting: Family

## 2024-01-08 ENCOUNTER — Ambulatory Visit

## 2024-01-12 ENCOUNTER — Encounter: Payer: Self-pay | Admitting: Family

## 2024-01-12 ENCOUNTER — Ambulatory Visit: Payer: Self-pay

## 2024-01-12 NOTE — Telephone Encounter (Signed)
 Patient sent a MyChart message to her provider with photos of this area She was also supposed to go to her dentist today but did not go to that appointment to have this area on her tongue examined She is going to try and reschedule with her dentist and wants her PCP's opinion as well  FYI Only or Action Required?: Action required by provider: clinical question for provider, update on patient condition, and Patient wants advice and is also trying to get back in to see her dentist.  Patient was last seen in primary care on 12/10/2023 by Corwin Antu, FNP.  Called Nurse Triage reporting Oral Pain.  Symptoms began yesterday.  Interventions attempted: Rest, hydration, or home remedies.  Symptoms are: unchanged.  Triage Disposition: Call Dentist When Office is Open  Patient/caregiver understands and will follow disposition?: Yes              Copied from CRM [id number]. Topic: Clinical - Red Word Triage >> Jan 12, 2024  1:36 PM Macario HERO wrote: Red Word that prompted transfer to Nurse Triage: Patient called said she has oral thrush and needs a prescription sent to pharmacy. Reason for Disposition  White patch in mouth or on tongue  Answer Assessment - Initial Assessment Questions Patient states that she is having possibly oral thrush---but patient states it is just two little white spots on the back of her tongue in the middle Patient states it is just a little irritated Yesterday patient first noticed this area Patient states that she cannot scrape this area off Patient states her dentist wanted her to come in so they could see this but she didn't go to that appointment  No chance of pregnancy.  Patient states that she didn't go to her appointment today with her dentist just by choice but she is going to give them a call back and get set back up with them to be seen for this spot.  Patient is advised to call us  back if anything changes or with any further  questions/concerns. Patient is advised that if anything worsens to go to the Emergency Room. Patient verbalized understanding.  Protocols used: Mouth Symptoms-A-AH

## 2024-01-12 NOTE — Telephone Encounter (Signed)
 There is already a MyChart message on this same issue. That message has been sent to Tabitha to address.

## 2024-01-12 NOTE — Telephone Encounter (Signed)
 NOTED

## 2024-01-17 ENCOUNTER — Encounter: Payer: Self-pay | Admitting: Family

## 2024-01-21 ENCOUNTER — Ambulatory Visit

## 2024-01-25 ENCOUNTER — Ambulatory Visit
Admission: EM | Admit: 2024-01-25 | Discharge: 2024-01-25 | Disposition: A | Discharge status: Home / Self Care | Attending: Emergency Medicine | Admitting: Emergency Medicine

## 2024-01-25 ENCOUNTER — Other Ambulatory Visit: Payer: Self-pay | Admitting: Family

## 2024-01-25 ENCOUNTER — Encounter: Payer: Self-pay | Admitting: *Deleted

## 2024-01-25 DIAGNOSIS — J040 Acute laryngitis: Secondary | ICD-10-CM | POA: Diagnosis not present

## 2024-01-25 DIAGNOSIS — J069 Acute upper respiratory infection, unspecified: Secondary | ICD-10-CM

## 2024-01-25 DIAGNOSIS — Z3041 Encounter for surveillance of contraceptive pills: Secondary | ICD-10-CM

## 2024-01-25 LAB — POC COVID19/FLU A&B COMBO
Covid Antigen, POC: NEGATIVE
Influenza A Antigen, POC: NEGATIVE
Influenza B Antigen, POC: NEGATIVE

## 2024-01-25 MED ORDER — LIDOCAINE VISCOUS HCL 2 % MT SOLN: 15.0000 mL | OROMUCOSAL | 0 refills | Status: AC | PRN

## 2024-01-25 NOTE — Telephone Encounter (Signed)
 I spoke with pts husband due to pt not able to talk due to lose of voice. Pt starting on  01/22/24 head congestion prod cough with green phlegm, S/T, pt taking either tylenol or ibuprofen so not sure if has fever or not; pt has H/A but no dizziness or CP. Pt does have SOB on and off andpt has acid reflux.  Pt has white splothes on her tongue.  Pt has not taken covid or flu test but pts husband was diagnosed with flu about 1 wk ago. No available appts at [facility] or [facility] this afternoon or 02/01/24 pt is going to go go to [facility] this afternoon.,pt wants CXR. pt wants to know if [facility] has available appt this afternoon; advised pt no. Pt is going to call Mliss Spray NP at [facility] to see if she will work pt in today but if not pt said she would go to [facility].Sending note to ONEIDA Patrick FNP who is out of office and CHRISTELLA Gaskins NP who is in office.

## 2024-01-25 NOTE — ED Provider Notes (Signed)
 " [facility]    CSN: [id number] Arrival date & time: 01/25/24  1635      History   Chief Complaint Chief Complaint  Patient presents with   Cough   Nasal Congestion    HPI Chelsea Jackson is a 39 y.o. female.  Accompanied by her husband, patient presents with hoarse voice, sore throat, congestion, cough, shortness of breath x 3 days.  She states her symptoms have been recurrent for several months and she has been treated with antibiotics and steroids for these episodes.  She has an appointment to see ENT tomorrow.  Patient reports no fever or chest pain.  The history is provided by the patient, the spouse and medical records.    Past Medical History:  Diagnosis Date   Anxiety    Bronchitis    GERD (gastroesophageal reflux disease)    Herpes    Thyroid  disease     Patient Active Problem List   Diagnosis Date Noted   Laryngitis 12/18/2023   Bronchitis 11/03/2023   Foreign body of left eye 12/12/2022   GAD (generalized anxiety disorder) 10/15/2021   Intermittent explosive disorder 10/15/2021   Hypothyroidism 04/08/2021   Raynaud's disease 04/08/2021   Herpes simplex type 2 infection 04/08/2021   GERD (gastroesophageal reflux disease) 04/08/2021   Benign mole 04/08/2021   ADD (attention deficit disorder) 12/30/2017   Hoarseness 03/25/2016    History reviewed. No pertinent surgical history.  OB History     Gravida  0   Para  0   Term  0   Preterm  0   AB  0   Living  0      SAB  0   IAB  0   Ectopic  0   Multiple  0   Live Births  0            Home Medications    Prior to Admission medications  Medication Sig Start Date End Date Taking? Authorizing Provider  lidocaine  (XYLOCAINE ) 2 % solution Use as directed 15 mLs in the mouth or throat as needed for mouth pain (Gargle and spit out.). 01/25/24  Yes Corlis Burnard DEL, NP  albuterol  (VENTOLIN  HFA) 108 (90 Base) MCG/ACT inhaler Inhale 2 puffs into the lungs every 6 (six) hours as  needed for wheezing or shortness of breath. 11/03/23   Gretel App, NP  amphetamine -dextroamphetamine  (ADDERALL XR) 30 MG 24 hr capsule Take 30 mg by mouth in the morning and at bedtime.    [provider]  amphetamine -dextroamphetamine  (ADDERALL) 15 MG tablet Take 15 mg by mouth daily.    [provider]  Biotin 1 MG CAPS Take by mouth.    [provider]  Cholecalciferol (VITAMIN D3) 1000 units CAPS Take by mouth.    [provider]  clonazePAM (KLONOPIN) 1 MG tablet Take 1 mg by mouth at bedtime as needed. 10/22/21   [provider]  cyanocobalamin (VITAMIN B12) 1000 MCG tablet Take 1,000 mcg by mouth daily.    [provider]  fluticasone  (FLONASE ) 50 MCG/ACT nasal spray Place 2 sprays into both nostrils daily. 11/30/23   Gretel App, NP  lamoTRIgine (LAMICTAL) 100 MG tablet SMARTSIG:1.0 Tablet(s) By Mouth Daily 11/13/22   [provider]  lamoTRIgine (LAMICTAL) 25 MG tablet Take 50 mg by mouth daily. 12/06/21   [provider]  levothyroxine  (SYNTHROID ) 50 MCG tablet TAKE 1 TABLET BY MOUTH EVERY DAY 09/21/23   Corwin Antu, FNP  Multiple Vitamins-Minerals (MULTIVITAMIN ADULT  PO) Take by mouth daily.    [provider]  norethindrone -ethinyl estradiol  1/35 (ALYACEN  1/35) tablet TAKE 1 TABLET BY MOUTH EVERY DAY 11/10/23   Corwin Antu, FNP  pantoprazole  (PROTONIX ) 40 MG tablet Take 1 tablet (40 mg total) by mouth daily. 12/10/23   Dugal, Tabitha, FNP  valACYclovir  (VALTREX ) 500 MG tablet TAKE 1 TABLET (500 MG TOTAL) BY MOUTH DAILY. 06/08/23   Dugal, Tabitha, FNP  venlafaxine XR (EFFEXOR-XR) 75 MG 24 hr capsule Take 150 mg by mouth daily. 12/06/21   [provider]    Family History Family History  Problem Relation Age of Onset   Leukemia Mother    Depression Father    Drug abuse Sister    Aneurysm Sister    ADD / ADHD Maternal Grandmother    Depression Maternal Grandmother    Learning disabilities  Maternal Grandmother    Alcohol abuse Maternal Grandfather    Depression Paternal Grandmother    Breast cancer Paternal Aunt        older age dx    Social History Social History[1]   Allergies   Patient has no known allergies.   Review of Systems Review of Systems  Constitutional:  Negative for chills and fever.  HENT:  Positive for congestion, sore throat and voice change. Negative for ear pain.   Respiratory:  Positive for cough and shortness of breath.   Cardiovascular:  Negative for chest pain and palpitations.     Physical Exam Triage Vital Signs ED Triage Vitals  Encounter Vitals Group     BP 01/25/24 1654 (!) 144/94     Girls Systolic BP Percentile --      Girls Diastolic BP Percentile --      Boys Systolic BP Percentile --      Boys Diastolic BP Percentile --      Pulse Rate 01/25/24 1654 82     Resp 01/25/24 1654 18     Temp 01/25/24 1654 98.2 F (36.8 C)     Temp src --      SpO2 01/25/24 1654 96 %     Weight 01/25/24 1700 160 lb (72.6 kg)     Height --      Head Circumference --      Peak Flow --      Pain Score 01/25/24 1700 8     Pain Loc --      Pain Education --      Exclude from Growth Chart --    No data found.  Updated Vital Signs BP (!) 144/94   Pulse 82   Temp 98.2 F (36.8 C)   Resp 18   Wt 160 lb (72.6 kg)   LMP 01/11/2024 (Approximate)   SpO2 96%   BMI 30.23 kg/m   Visual Acuity Right Eye Distance:   Left Eye Distance:   Bilateral Distance:    Right Eye Near:   Left Eye Near:    Bilateral Near:     Physical Exam Constitutional:      General: She is not in acute distress. HENT:     Right Ear: Tympanic membrane normal.     Left Ear: Tympanic membrane normal.     Nose: Nose normal.     Mouth/Throat:     Mouth: Mucous membranes are moist.     Pharynx: Oropharynx is clear.     Comments: PND. Voice hoarse.  Cardiovascular:     Rate and Rhythm: Normal rate and regular rhythm.  Heart sounds: Normal heart sounds.   Pulmonary:     Effort: Pulmonary effort is normal. No respiratory distress.     Breath sounds: Normal breath sounds. No wheezing, rhonchi or rales.  Neurological:     Mental Status: She is alert.      UC Treatments / Results  Labs (all labs ordered are listed, but only abnormal results are displayed) Labs Reviewed  POC COVID19/FLU A&B COMBO    EKG   Radiology No results found.  Procedures Procedures (including critical care time)  Medications Ordered in UC Medications - No data to display  Initial Impression / Assessment and Plan / UC Course  I have reviewed the triage vital signs and the nursing notes.  Pertinent labs & imaging results that were available during my care of the patient were reviewed by me and considered in my medical decision making (see chart for details).    Laryngitis, viral URI.  Afebrile.  Lungs are clear and O2 sat is 96% on room air.  Rapid flu and COVID are negative.  Patient has an appointment with ENT tomorrow.  She has been seen by her PCP several times in the last 4 months for her symptoms.  Treating her sore throat today with viscous lidocaine .  Education provided on laryngitis and URI.  Instructed patient to follow-up with her ENT tomorrow as scheduled.  Instructed her to follow-up with her PCP as soon as possible.  ED precautions given.  She agrees to plan of care.  Final Clinical Impressions(s) / UC Diagnoses   Final diagnoses:  Laryngitis  Viral URI     Discharge Instructions      Your COVID and flu tests are negative.    Use the viscous lidocaine  as directed for your sore throat.    Follow-up as scheduled with ENT tomorrow.    Follow up with your primary care provider.  Go to the emergency department if you have worsening symptoms.        ED Prescriptions     Medication Sig Dispense Auth. Provider   lidocaine  (XYLOCAINE ) 2 % solution Use as directed 15 mLs in the mouth or throat as needed for mouth pain (Gargle and  spit out.). 100 mL Corlis Burnard DEL, NP      PDMP not reviewed this encounter.    [1]  Social History Tobacco Use   Smoking status: Former    Current packs/day: 0.25    Average packs/day: 0.3 packs/day for 0.5 years (0.1 ttl pk-yrs)    Types: Cigarettes, E-cigarettes   Smokeless tobacco: Never  Vaping Use   Vaping status: Never Used  Substance Use Topics   Alcohol use: No   Drug use: No     Corlis Burnard DEL, NP 01/25/24 1744  "

## 2024-01-25 NOTE — Discharge Instructions (Addendum)
 Your COVID and flu tests are negative.    Use the viscous lidocaine  as directed for your sore throat.    Follow-up as scheduled with ENT tomorrow.    Follow up with your primary care provider.  Go to the emergency department if you have worsening symptoms.

## 2024-01-25 NOTE — Telephone Encounter (Signed)
Agree with being evaluated at [facility]

## 2024-01-25 NOTE — ED Triage Notes (Signed)
 Patient states 3 days of cough, congestion, but recurrent symptoms for months.  Will see ENT tomorrow.  Taking Mucinex, Dayquil with little relief

## 2024-01-26 NOTE — Telephone Encounter (Signed)
 noted

## 2024-01-28 ENCOUNTER — Other Ambulatory Visit: Payer: Self-pay | Admitting: Family

## 2024-01-28 DIAGNOSIS — E039 Hypothyroidism, unspecified: Secondary | ICD-10-CM

## 2024-02-03 ENCOUNTER — Ambulatory Visit

## 2024-02-04 NOTE — Telephone Encounter (Signed)
 Needs appt

## 2024-02-04 NOTE — Telephone Encounter (Signed)
 Copied from CRM #[id number]. Topic: Clinical - Medication Question >> Feb 04, 2024  1:07 PM Taleah C wrote: Reason for CRM: pt called in regards to her mychart msg abt wanting a prescription for weight loss. I informed her that PCP put a message in today that she is going to need an appt. Pt declined scheduling during our call and asked for Tabitha's nurse to call her instead. Please call and advise.

## 2024-02-04 NOTE — Telephone Encounter (Signed)
 Spoke with pt. I have explained to her that she would need an appointment for us  to properly document her weight, height and BMI for insurance purposes before glp1 can be prescribed. She has been scheduled to see Tabitha on 02/11/24 at 1240.

## 2024-02-08 ENCOUNTER — Ambulatory Visit

## 2024-02-09 ENCOUNTER — Encounter: Payer: Self-pay | Admitting: Family

## 2024-02-09 ENCOUNTER — Telehealth: Admitting: Family

## 2024-02-09 ENCOUNTER — Institutional Professional Consult (permissible substitution) (INDEPENDENT_AMBULATORY_CARE_PROVIDER_SITE_OTHER): Admitting: Otolaryngology

## 2024-02-09 VITALS — Ht 61.0 in | Wt 175.0 lb

## 2024-02-09 DIAGNOSIS — E669 Obesity, unspecified: Secondary | ICD-10-CM

## 2024-02-09 DIAGNOSIS — R030 Elevated blood-pressure reading, without diagnosis of hypertension: Secondary | ICD-10-CM

## 2024-02-09 DIAGNOSIS — E039 Hypothyroidism, unspecified: Secondary | ICD-10-CM

## 2024-02-10 ENCOUNTER — Ambulatory Visit

## 2024-02-11 ENCOUNTER — Ambulatory Visit: Admitting: Family

## 2024-02-11 MED ORDER — ZEPBOUND 2.5 MG/0.5ML ~~LOC~~ SOAJ: 2.5000 mg | SUBCUTANEOUS | 0 refills | Status: AC

## 2024-02-11 NOTE — Telephone Encounter (Signed)
 Can we start prior auth for zepbound 

## 2024-02-15 ENCOUNTER — Ambulatory Visit

## 2024-02-16 ENCOUNTER — Ambulatory Visit: Admitting: Family Medicine

## 2024-02-17 ENCOUNTER — Ambulatory Visit

## 2024-02-22 ENCOUNTER — Ambulatory Visit

## 2024-02-24 ENCOUNTER — Ambulatory Visit

## 2024-02-29 ENCOUNTER — Ambulatory Visit

## 2024-03-02 ENCOUNTER — Ambulatory Visit

## 2024-03-07 ENCOUNTER — Ambulatory Visit

## 2024-03-09 ENCOUNTER — Ambulatory Visit

## 2024-03-14 ENCOUNTER — Ambulatory Visit

## 2024-03-16 ENCOUNTER — Ambulatory Visit

## 2024-03-21 ENCOUNTER — Ambulatory Visit

## 2024-03-23 ENCOUNTER — Ambulatory Visit

## 2024-03-28 ENCOUNTER — Ambulatory Visit

## 2024-03-30 ENCOUNTER — Ambulatory Visit

## 2024-04-04 ENCOUNTER — Ambulatory Visit

## 2024-04-06 ENCOUNTER — Ambulatory Visit

## 2024-04-11 ENCOUNTER — Ambulatory Visit

## 2024-04-13 ENCOUNTER — Ambulatory Visit
